# Patient Record
Sex: Male | Born: 1970 | Hispanic: Yes | Marital: Married | Smoking: Never smoker
Health system: Southern US, Community
[De-identification: ages and names within clinical notes are randomized; demographics above are authoritative.]

---

## 2019-02-10 ENCOUNTER — Encounter: Payer: Self-pay | Admitting: Emergency Medicine

## 2019-02-10 ENCOUNTER — Emergency Department: Payer: Self-pay

## 2019-02-10 ENCOUNTER — Other Ambulatory Visit: Payer: Self-pay

## 2019-02-10 ENCOUNTER — Inpatient Hospital Stay
Admission: EM | Admit: 2019-02-10 | Discharge: 2019-02-13 | DRG: 871 | Disposition: A | Discharge status: Other Acute Inpatient Hospital | Payer: Self-pay | Attending: Internal Medicine | Admitting: Internal Medicine

## 2019-02-10 DIAGNOSIS — J1289 Other viral pneumonia: Secondary | ICD-10-CM | POA: Diagnosis present

## 2019-02-10 DIAGNOSIS — J069 Acute upper respiratory infection, unspecified: Secondary | ICD-10-CM | POA: Diagnosis not present

## 2019-02-10 DIAGNOSIS — U071 COVID-19: Secondary | ICD-10-CM | POA: Diagnosis present

## 2019-02-10 DIAGNOSIS — Z6841 Body Mass Index (BMI) 40.0 and over, adult: Secondary | ICD-10-CM

## 2019-02-10 DIAGNOSIS — Z7982 Long term (current) use of aspirin: Secondary | ICD-10-CM

## 2019-02-10 DIAGNOSIS — R0902 Hypoxemia: Secondary | ICD-10-CM

## 2019-02-10 DIAGNOSIS — A419 Sepsis, unspecified organism: Secondary | ICD-10-CM | POA: Diagnosis present

## 2019-02-10 DIAGNOSIS — A4189 Other specified sepsis: Principal | ICD-10-CM | POA: Diagnosis present

## 2019-02-10 DIAGNOSIS — Z23 Encounter for immunization: Secondary | ICD-10-CM

## 2019-02-10 DIAGNOSIS — J9601 Acute respiratory failure with hypoxia: Secondary | ICD-10-CM | POA: Diagnosis present

## 2019-02-10 LAB — COMPREHENSIVE METABOLIC PANEL
ALT: 105 U/L — ABNORMAL HIGH (ref 0–44)
AST: 61 U/L — ABNORMAL HIGH (ref 15–41)
Albumin: 3.6 g/dL (ref 3.5–5.0)
Alkaline Phosphatase: 71 U/L (ref 38–126)
Anion gap: 8 (ref 5–15)
BUN: 11 mg/dL (ref 6–20)
CO2: 22 mmol/L (ref 22–32)
Calcium: 8.1 mg/dL — ABNORMAL LOW (ref 8.9–10.3)
Chloride: 102 mmol/L (ref 98–111)
Creatinine, Ser: 1.02 mg/dL (ref 0.61–1.24)
GFR calc Af Amer: >60 mL/min (ref >60)
GFR calc non Af Amer: >60 mL/min (ref >60)
Glucose, Bld: 157 mg/dL — ABNORMAL HIGH (ref 70–99)
Potassium: 3.7 mmol/L (ref 3.5–5.1)
Sodium: 132 mmol/L — ABNORMAL LOW (ref 135–145)
Total Bilirubin: 0.5 mg/dL (ref 0.3–1.2)
Total Protein: 7.2 g/dL (ref 6.5–8.1)

## 2019-02-10 LAB — CBC WITH DIFFERENTIAL/PLATELET
Basophils Absolute: 0 10*3/uL (ref 0.0–0.1)
Basophils Relative: 0 %
Eosinophils Absolute: 0 10*3/uL (ref 0.0–0.5)
Eosinophils Relative: 0 %
HCT: 40.8 % (ref 39.0–52.0)
Hemoglobin: 14.4 g/dL (ref 13.0–17.0)
Lymphocytes Relative: 24 %
Lymphs Abs: 1.2 10*3/uL (ref 0.7–4.0)
MCH: 30.1 pg (ref 26.0–34.0)
MCHC: 35.3 g/dL (ref 30.0–36.0)
MCV: 85.2 fL (ref 80.0–100.0)
Monocytes Absolute: 0.3 10*3/uL (ref 0.1–1.0)
Monocytes Relative: 7 %
Neutro Abs: 3.3 10*3/uL (ref 1.7–7.7)
Neutrophils Relative %: 69 %
Platelets: 180 10*3/uL (ref 150–400)
RBC: 4.79 MIL/uL (ref 4.22–5.81)
RDW: 13.2 % (ref 11.5–15.5)
WBC: 4.9 10*3/uL (ref 4.0–10.5)
nRBC: 0 % (ref 0.0–0.2)

## 2019-02-10 LAB — FIBRIN DERIVATIVES D-DIMER (ARMC ONLY): Fibrin derivatives D-dimer (ARMC): 700.06 ng/mL (FEU) — ABNORMAL HIGH (ref 0.00–499.00)

## 2019-02-10 LAB — LACTIC ACID, PLASMA
Lactic Acid, Venous: 1.3 mmol/L (ref 0.5–1.9)
Lactic Acid, Venous: 1.2 mmol/L (ref 0.5–1.9)

## 2019-02-10 LAB — TYPE AND SCREEN
ABO/RH(D): O POS
Antibody Screen: NEGATIVE

## 2019-02-10 LAB — C-REACTIVE PROTEIN: CRP: 5.1 mg/dL — ABNORMAL HIGH (ref <1.0)

## 2019-02-10 LAB — TROPONIN I (HIGH SENSITIVITY): Troponin I (High Sensitivity): 6 ng/L (ref <18)

## 2019-02-10 LAB — ABO/RH: ABO/RH(D): O POS

## 2019-02-10 LAB — PROCALCITONIN: Procalcitonin: 0.1 ng/mL

## 2019-02-10 LAB — FERRITIN: Ferritin: 465 ng/mL — ABNORMAL HIGH (ref 24–336)

## 2019-02-10 LAB — FIBRINOGEN: Fibrinogen: 606 mg/dL — ABNORMAL HIGH (ref 210–475)

## 2019-02-10 LAB — HIV ANTIBODY (ROUTINE TESTING W REFLEX): HIV Screen 4th Generation wRfx: NONREACTIVE

## 2019-02-10 LAB — LACTATE DEHYDROGENASE: LDH: 323 U/L — ABNORMAL HIGH (ref 98–192)

## 2019-02-10 LAB — TRIGLYCERIDES: Triglycerides: 72 mg/dL (ref <150)

## 2019-02-10 MED ORDER — ALBUTEROL SULFATE HFA 108 (90 BASE) MCG/ACT IN AERS
2.0000 | INHALATION_SPRAY | RESPIRATORY_TRACT | Status: DC | PRN
  Administered 2019-02-10: 2 via RESPIRATORY_TRACT
  Filled 2019-02-10: qty 6.7

## 2019-02-10 MED ORDER — ONDANSETRON HCL 4 MG/2ML IJ SOLN: 4.0000 mg | Freq: Three times a day (TID) | INTRAMUSCULAR | Status: DC | PRN

## 2019-02-10 MED ORDER — METHYLPREDNISOLONE SODIUM SUCC 40 MG IJ SOLR
40.0000 mg | Freq: Two times a day (BID) | INTRAMUSCULAR | Status: DC
  Administered 2019-02-10: 40 mg via INTRAVENOUS
  Filled 2019-02-10 (×2): qty 1

## 2019-02-10 MED ORDER — DEXAMETHASONE SODIUM PHOSPHATE 10 MG/ML IJ SOLN
10.0000 mg | Freq: Once | INTRAMUSCULAR | Status: AC
  Administered 2019-02-10: 05:00:00 10 mg via INTRAVENOUS

## 2019-02-10 MED ORDER — ENOXAPARIN SODIUM 40 MG/0.4ML ~~LOC~~ SOLN
40.0000 mg | SUBCUTANEOUS | Status: DC
  Administered 2019-02-10 – 2019-02-12 (×3): 40 mg via SUBCUTANEOUS
  Filled 2019-02-10 (×3): qty 0.4

## 2019-02-10 MED ORDER — GUAIFENESIN 100 MG/5ML PO SOLN
5.0000 mL | ORAL | Status: DC | PRN
  Administered 2019-02-12: 100 mg via ORAL
  Filled 2019-02-10 (×3): qty 5

## 2019-02-10 MED ORDER — SODIUM CHLORIDE 0.9 % IV SOLN
100.0000 mg | Freq: Every day | INTRAVENOUS | Status: DC
  Administered 2019-02-11 – 2019-02-13 (×3): 100 mg via INTRAVENOUS
  Filled 2019-02-10: qty 100
  Filled 2019-02-10: qty 20
  Filled 2019-02-10 (×2): qty 100

## 2019-02-10 MED ORDER — ZINC SULFATE 220 (50 ZN) MG PO CAPS
220.0000 mg | ORAL_CAPSULE | Freq: Every day | ORAL | Status: DC
  Administered 2019-02-10 – 2019-02-13 (×4): 220 mg via ORAL
  Filled 2019-02-10 (×4): qty 1

## 2019-02-10 MED ORDER — INFLUENZA VAC SPLIT QUAD 0.5 ML IM SUSY: 0.5000 mL | PREFILLED_SYRINGE | INTRAMUSCULAR | Status: DC

## 2019-02-10 MED ORDER — SODIUM CHLORIDE 0.9 % IV SOLN
200.0000 mg | Freq: Once | INTRAVENOUS | Status: AC
  Administered 2019-02-10: 200 mg via INTRAVENOUS
  Filled 2019-02-10: qty 200

## 2019-02-10 MED ORDER — GUAIFENESIN ER 600 MG PO TB12
600.0000 mg | ORAL_TABLET | Freq: Two times a day (BID) | ORAL | Status: DC
  Administered 2019-02-10 – 2019-02-13 (×7): 600 mg via ORAL
  Filled 2019-02-10 (×9): qty 1

## 2019-02-10 MED ORDER — HYDROCOD POLST-CPM POLST ER 10-8 MG/5ML PO SUER
5.0000 mL | Freq: Once | ORAL | Status: AC
  Administered 2019-02-10: 5 mL via ORAL
  Filled 2019-02-10: qty 5

## 2019-02-10 MED ORDER — DEXTROMETHORPHAN POLISTIREX ER 30 MG/5ML PO SUER
30.0000 mg | Freq: Two times a day (BID) | ORAL | Status: DC
  Administered 2019-02-10 – 2019-02-13 (×7): 30 mg via ORAL
  Filled 2019-02-10 (×11): qty 5

## 2019-02-10 MED ORDER — IPRATROPIUM BROMIDE HFA 17 MCG/ACT IN AERS
2.0000 | INHALATION_SPRAY | RESPIRATORY_TRACT | Status: DC
  Administered 2019-02-10 – 2019-02-13 (×17): 2 via RESPIRATORY_TRACT
  Filled 2019-02-10 (×3): qty 12.9

## 2019-02-10 MED ORDER — VITAMIN C 500 MG PO TABS
500.0000 mg | ORAL_TABLET | Freq: Every day | ORAL | Status: DC
  Administered 2019-02-10 – 2019-02-13 (×4): 500 mg via ORAL
  Filled 2019-02-10 (×4): qty 1

## 2019-02-10 MED ORDER — ACETAMINOPHEN 325 MG PO TABS
650.0000 mg | ORAL_TABLET | Freq: Four times a day (QID) | ORAL | Status: DC | PRN
  Administered 2019-02-10 – 2019-02-13 (×4): 650 mg via ORAL
  Filled 2019-02-10 (×4): qty 2

## 2019-02-10 MED ORDER — DM-GUAIFENESIN ER 30-600 MG PO TB12: 1.0000 | ORAL_TABLET | Freq: Two times a day (BID) | ORAL | Status: DC

## 2019-02-10 MED ORDER — DEXAMETHASONE SODIUM PHOSPHATE 10 MG/ML IJ SOLN
INTRAMUSCULAR | Status: AC
  Administered 2019-02-10: 10 mg via INTRAVENOUS
  Filled 2019-02-10: qty 1

## 2019-02-10 MED ORDER — METHYLPREDNISOLONE SODIUM SUCC 125 MG IJ SOLR
60.0000 mg | Freq: Two times a day (BID) | INTRAMUSCULAR | Status: DC
  Administered 2019-02-10 – 2019-02-13 (×6): 60 mg via INTRAVENOUS
  Filled 2019-02-10 (×3): qty 2
  Filled 2019-02-10: qty 0.96
  Filled 2019-02-10 (×3): qty 2

## 2019-02-10 MED ORDER — SENNOSIDES-DOCUSATE SODIUM 8.6-50 MG PO TABS
1.0000 | ORAL_TABLET | Freq: Every evening | ORAL | Status: DC | PRN
  Filled 2019-02-10: qty 1

## 2019-02-10 NOTE — ED Notes (Signed)
Received report from [place], [place].

## 2019-02-10 NOTE — Progress Notes (Signed)
Remdesivir - Pharmacy Brief Note   O:  ALT:  CXR:  SpO2: % on    A/P:  Remdesivir 200 mg IVPB once followed by 100 mg IVPB daily x 4 days.   Hart Robinsons, PharmD Clinical Pharmacist 02/10/2019   02/10/2019 5:23 AM

## 2019-02-10 NOTE — ED Notes (Signed)
Pt still watching tv. Resting. States family on way to drop off personal items. Bed locked low. Rail up. Call bell within reach.

## 2019-02-10 NOTE — ED Notes (Signed)
Pt urinated 600cc into urinal before leaving for floor.

## 2019-02-10 NOTE — ED Triage Notes (Addendum)
Patient ambulatory to triage with steady gait, without difficulty or distress noted, mask in place; pt reports SHOB and mid CP; +COVID on Saturday; took ASA PTA for fever

## 2019-02-10 NOTE — H&P (Signed)
History and Physical    Walter Oliver [id number]:[id number] DOB: 1970-03-31 DOA: 02/10/2019  Referring MD/NP/PA:   PCP: Patient, No Pcp Per   Patient coming from:  The patient is coming from home.  At baseline, pt is independent for most of ADL.        Chief Complaint: SOB  HPI: Walter Oliver is a 48 y.o. male without significant medical history, who presents with SOB.  Per his son (I called his son by phone), pt has positive COVID 19 test on Saturday. He has cough and SOB, which has been progressively worsening.  Patient also has mild chest pain, fever and chills.  No nausea, vomiting, diarrhea or abdominal pain.  No symptoms of UTI.  Patient still takes aspirin for fever at home.  ED Course: pt was found to have WBC 4.9, trop 6, lactic acid 1.3, D-dimer 700, LDH 323, procalcitonin <010, ferritin 465, fibrinogen 606, TG 72.  Chest x-ray showed diffuse the bilateral effusion.  Patient is admitted to [facility] bed as inpatient.  Review of Systems:   General: has fevers, chills, no body weight gain,  has fatigue HEENT: no blurry vision, hearing changes or sore throat Respiratory: has dyspnea, coughing, no wheezing CV: no chest pain, no palpitations GI: no nausea, vomiting, abdominal pain, diarrhea, constipation GU: no dysuria, burning on urination, increased urinary frequency, hematuria  Ext: no leg edema Neuro: no unilateral weakness, numbness, or tingling, no vision change or hearing loss Skin: no rash, no skin tear. MSK: No muscle spasm, no deformity, no limitation of range of movement in spin Heme: No easy bruising.  Travel history: No recent long distant travel.  Allergy: No Known Allergies  History reviewed. No pertinent past medical history.  History reviewed. No pertinent surgical history.  Social History:  reports that he has never smoked. He has never used smokeless tobacco. He reports that he does not drink alcohol or use drugs.  Family History: No family history on  file. reviewed, per his son, all family members are healthy no significant medical issues.  Prior to Admission medications   Medication Sig Start Date End Date Taking? Authorizing Provider  aspirin EC 325 MG tablet Take 325 mg by mouth every 6 (six) hours as needed for fever.   Yes [provider]    Physical Exam: Vitals:   02/10/19 1424 02/10/19 1500 02/10/19 1622 02/10/19 1622  BP: 132/66 (!) 108/55 139/85 139/85  Pulse: 89 91 93 93  Resp:   20   Temp: 98.6 F (37 C)  98.7 F (37.1 C) 98.7 F (37.1 C)  TempSrc: Oral  Oral Oral  SpO2: 93% 91%  91%  Weight:   117.9 kg    General: Not in acute distress HEENT:       Eyes: PERRL, EOMI, no scleral icterus.       ENT: No discharge from the ears and nose, no pharynx injection, no tonsillar enlargement.        Neck: No JVD, no bruit, no mass felt. Heme: No neck lymph node enlargement. Cardiac: S1/S2, RRR, No murmurs, No gallops or rubs. Respiratory: No rales, wheezing, rhonchi or rubs. GI: Soft, nondistended, nontender, no rebound pain, no organomegaly, BS present. GU: No hematuria Ext: No pitting leg edema bilaterally. 2+DP/PT pulse bilaterally. Musculoskeletal: No joint deformities, No joint redness or warmth, no limitation of ROM in spin. Skin: No rashes.  Neuro: Alert, oriented X3, cranial nerves II-XII grossly intact, moves all extremities normally.  Psych: Patient is not psychotic, no  suicidal or hemocidal ideation.  Labs on Admission: I have personally reviewed following labs and imaging studies  CBC: Recent Labs  Lab 02/10/19 0051  WBC 4.9  NEUTROABS 3.3  HGB 14.4  HCT 40.8  MCV 85.2  PLT 180   Basic Metabolic Panel: Recent Labs  Lab 02/10/19 0051  NA 132*  K 3.7  CL 102  CO2 22  GLUCOSE 157*  BUN 11  CREATININE 1.02  CALCIUM 8.1*   GFR: CrCl cannot be calculated (Unknown ideal weight.). Liver Function Tests: Recent Labs  Lab 02/10/19 0051  AST 61*  ALT 105*  ALKPHOS 71  BILITOT 0.5   PROT 7.2  ALBUMIN 3.6   No results for input(s): LIPASE, AMYLASE in the last 168 hours. No results for input(s): AMMONIA in the last 168 hours. Coagulation Profile: No results for input(s): INR, PROTIME in the last 168 hours. Cardiac Enzymes: No results for input(s): CKTOTAL, CKMB, CKMBINDEX, TROPONINI in the last 168 hours. BNP (last 3 results) No results for input(s): PROBNP in the last 8760 hours. HbA1C: No results for input(s): HGBA1C in the last 72 hours. CBG: No results for input(s): GLUCAP in the last 168 hours. Lipid Profile: Recent Labs    02/10/19 0424  TRIG 72   Thyroid Function Tests: No results for input(s): TSH, T4TOTAL, FREET4, T3FREE, THYROIDAB in the last 72 hours. Anemia Panel: Recent Labs    02/10/19 0424  FERRITIN 465*   Urine analysis: No results found for: COLORURINE, APPEARANCEUR, LABSPEC, PHURINE, GLUCOSEU, HGBUR, BILIRUBINUR, KETONESUR, PROTEINUR, UROBILINOGEN, NITRITE, LEUKOCYTESUR Sepsis Labs: @LABRCNTIP (procalcitonin:4,lacticidven:4) ) Recent Results (from the past 240 hour(s))  Blood Culture (routine x 2)     Status: None (Preliminary result)   Collection Time: 02/10/19  4:24 AM   Specimen: BLOOD  Result Value Ref Range Status   Specimen Description BLOOD LEFT FA  Final   Special Requests   Final    BOTTLES DRAWN AEROBIC AND ANAEROBIC Blood Culture adequate volume   Culture   Final    NO GROWTH <12 HOURS Performed at [facility], [address] [place], [place]    Report Status PENDING  Incomplete  Blood Culture (routine x 2)     Status: None (Preliminary result)   Collection Time: 02/10/19  4:24 AM   Specimen: BLOOD  Result Value Ref Range Status   Specimen Description BLOOD RIGHT AC  Final   Special Requests   Final    BOTTLES DRAWN AEROBIC AND ANAEROBIC Blood Culture results may not be optimal due to an excessive volume of blood received in culture bottles   Culture   Final    NO GROWTH <12 HOURS Performed  at [facility], [address] [place], [place]    Report Status PENDING  Incomplete     Radiological Exams on Admission: Dg Chest 2 View  Result Date: 02/10/2019 CLINICAL DATA:  Shortness of breath EXAM: CHEST - 2 VIEW COMPARISON:  None. FINDINGS: There are diffuse bilateral pulmonary opacities. The lung volumes are low. There is no pneumothorax. No large pleural effusion. Heart size appears mildly enlarged. IMPRESSION: Diffuse bilateral pulmonary opacities which can be seen in patients with an atypical infectious process such as viral pneumonia. Electronically Signed   By: 14/10/2018 M.D.   On: 02/10/2019 01:17     EKG: Independently reviewed.  Sinus rhythm, QTC 436, T wave inversion in lead III/aVF  Assessment/Plan Principal Problem:   Acute respiratory disease due to COVID-19 virus  Acute respiratory disease due to COVID-19 virus: current O2 sat 93 on RA. -will admit to med-surg bed as inpt -Remdesivir per pharm -Solumedrol 60 mg bid -vitamin C, zinc.   -Atrovent inhaler, PRN albuterol inhaler -PRN Mucinex for cough - follow-up flu PCR and RVP -f/u Blood culture -D-dimer, BNP,Trop, LFT, CRP, LDH, Procalcitonin, ferritin, fibinogen, TG, Hep B SAg, HIV ab -Daily CRP, Ferritin, D-dimer, -if desaturate, will ask the patient to maintain an awake prone position for 16+ hours a day, if possible, with a minimum of 2-3 hours at a time -Will attempt to maintain euvolemia to a net negative fluid status   DVT ppx: SQ Lovenox Code Status: Full code Family Communication: I called his son by phone Disposition Plan:  Anticipate discharge back to previous home environment Consults called:  none Admission status: med surg bed/as inpt  Date of Service 02/10/2019    Lorretta HarpXilin Alasia Enge [facility]   If 7PM-7AM, please contact night-coverage [email or web address] Password [email or web address] 02/10/2019, 9:57 PM

## 2019-02-10 NOTE — ED Notes (Signed)
Pt given bag of belongings dropped off by family.

## 2019-02-10 NOTE — ED Notes (Addendum)
Pt given lunch tray by NT. Pt watching tv.

## 2019-02-10 NOTE — ED Provider Notes (Signed)
[facility] Emergency Department Provider Note   ____________________________________________   First MD Initiated Contact with Patient 02/10/19 0407     (approximate)  I have reviewed the triage vital signs and the nursing notes.   HISTORY  Chief Complaint Cough and Shortness of Breath    HPI Walter Oliver is a 48 y.o. male who presents to the ED from home with a chief complaint of cough, mid chest pain and shortness of breath.  Patient tested positive for COVID-19 on 12/1.  Has been taking aspirin for fever.  Denies abdominal pain, nausea, vomiting or diarrhea.  Wife also has COVID-19.       Past medical history None  There are no active problems to display for this patient.   History reviewed. No pertinent surgical history.  Prior to Admission medications   Not on File    Allergies Patient has no known allergies.  No family history on file.  Social History Social History   Tobacco Use  . Smoking status: Never Smoker  . Smokeless tobacco: Never Used  Substance Use Topics  . Alcohol use: Not on file  . Drug use: Not on file    Review of Systems  Constitutional: No fever/chills Eyes: No visual changes. ENT: No sore throat. Cardiovascular: Positive for chest pain. Respiratory: Positive for cough and shortness of breath. Gastrointestinal: No abdominal pain.  No nausea, no vomiting.  No diarrhea.  No constipation. Genitourinary: Negative for dysuria. Musculoskeletal: Negative for back pain. Skin: Negative for rash. Neurological: Negative for headaches, focal weakness or numbness.   ____________________________________________   PHYSICAL EXAM:  VITAL SIGNS: ED Triage Vitals  Enc Vitals Group     BP 02/10/19 0046 138/68     Pulse Rate 02/10/19 0046 (!) 103     Resp 02/10/19 0046 20     Temp 02/10/19 0046 100.3 F (37.9 C)     Temp Source 02/10/19 0046 Oral     SpO2 02/10/19 0046 93 %     Weight 02/10/19 0047 260  lb (117.9 kg)     Height --      Head Circumference --      Peak Flow --      Pain Score 02/10/19 0045 8     Pain Loc --      Pain Edu? --      Excl. in GC? --     Constitutional: Alert and oriented. Well appearing and in mild acute distress. Eyes: Conjunctivae are normal. PERRL. EOMI. Head: Atraumatic. Nose: No congestion/rhinnorhea. Mouth/Throat: Mucous membranes are moist.  Oropharynx non-erythematous. Neck: No stridor.   Cardiovascular: Tachycardic rate, regular rhythm. Grossly normal heart sounds.  Good peripheral circulation. Respiratory: Increased respiratory effort.  No retractions. Lungs with scattered rhonchi. Gastrointestinal: Soft and nontender. No distention. No abdominal bruits. No CVA tenderness. Musculoskeletal: No lower extremity tenderness nor edema.  No joint effusions. Neurologic:  Normal speech and language. No gross focal neurologic deficits are appreciated. No gait instability. Skin:  Skin is warm, dry and intact. No rash noted. Psychiatric: Mood and affect are normal. Speech and behavior are normal.  ____________________________________________   LABS (all labs ordered are listed, but only abnormal results are displayed)  Labs Reviewed  COMPREHENSIVE METABOLIC PANEL - Abnormal; Notable for the following components:      Result Value   Sodium 132 (*)    Glucose, Bld 157 (*)    Calcium 8.1 (*)    AST 61 (*)    ALT 105 (*)  All other components within normal limits  CULTURE, BLOOD (ROUTINE X 2)  CULTURE, BLOOD (ROUTINE X 2)  CBC WITH DIFFERENTIAL/PLATELET  LACTIC ACID, PLASMA  LACTIC ACID, PLASMA  FIBRIN DERIVATIVES D-DIMER ([facility] ONLY)  PROCALCITONIN  LACTATE DEHYDROGENASE  FERRITIN  TRIGLYCERIDES  FIBRINOGEN  C-REACTIVE PROTEIN  TROPONIN I (HIGH SENSITIVITY)   ____________________________________________  EKG  ED ECG REPORT I, SUNG,JADE J, the attending physician, personally viewed and interpreted this ECG.   Date: 02/10/2019  EKG  Time: 0050  Rate: 100  Rhythm: normal EKG, normal sinus rhythm  Axis: Normal  Intervals:none  ST&T Change: T wave inversion  ____________________________________________  RADIOLOGY  ED MD interpretation: COVID-19 pneumonia  Official radiology report(s): Dg Chest 2 View  Result Date: 02/10/2019 CLINICAL DATA:  Shortness of breath EXAM: CHEST - 2 VIEW COMPARISON:  None. FINDINGS: There are diffuse bilateral pulmonary opacities. The lung volumes are low. There is no pneumothorax. No large pleural effusion. Heart size appears mildly enlarged. IMPRESSION: Diffuse bilateral pulmonary opacities which can be seen in patients with an atypical infectious process such as viral pneumonia. Electronically Signed   By: Constance Holster M.D.   On: 02/10/2019 01:17    ____________________________________________   PROCEDURES  Procedure(s) performed (including Critical Care):  Procedures CRITICAL CARE Performed by: Paulette Blanch   Total critical care time: 30 minutes  Critical care time was exclusive of separately billable procedures and treating other patients.  Critical care was necessary to treat or prevent imminent or life-threatening deterioration.  Critical care was time spent personally by me on the following activities: development of treatment plan with patient and/or surrogate as well as nursing, discussions with consultants, evaluation of patient's response to treatment, examination of patient, obtaining history from patient or surrogate, ordering and performing treatments and interventions, ordering and review of laboratory studies, ordering and review of radiographic studies, pulse oximetry and re-evaluation of patient's condition.  ____________________________________________   INITIAL IMPRESSION / ASSESSMENT AND PLAN / ED COURSE  As part of my medical decision making, I reviewed the following data within the [facility] notes reviewed and incorporated,  Labs reviewed, EKG interpreted, Old chart reviewed, Radiograph reviewed, Discussed with admitting physician and Notes from prior ED visits     [name] was evaluated in Emergency Department on 02/10/2019 for the symptoms described in the history of present illness. He was evaluated in the context of the global COVID-19 pandemic, which necessitated consideration that the patient might be at risk for infection with the SARS-CoV-2 virus that causes COVID-19. Institutional protocols and algorithms that pertain to the evaluation of patients at risk for COVID-19 are in a state of rapid change based on information released by regulatory bodies including the CDC and federal and state organizations. These policies and algorithms were followed during the patient's care in the ED.    48 year old male with COVID-19 who presents with chest pain, cough and shortness of breath. Differential includes, but is not limited to, viral syndrome, bronchitis including COPD exacerbation, pneumonia, reactive airway disease including asthma, CHF including exacerbation with or without pulmonary/interstitial edema, pneumothorax, ACS, thoracic trauma, and pulmonary embolism.  Laboratory results unremarkable.  Chest x-ray consistent with atypical process.  Will start IV Decadron; ambulate with pulse ox.   Clinical Course as of Feb 09 501  Tue Feb 10, 2019  0502 Patient ambulated very slowly to treatment room.  On arrival his room air saturations were 74%.  He rebounded quickly on 3 L nasal cannula oxygen and rest.  Currently on 3 L oxygen he is 95%.   [JS]    Clinical Course User Index [JS] Irean Hong, MD     ____________________________________________   FINAL CLINICAL IMPRESSION(S) / ED DIAGNOSES  Final diagnoses:  Pneumonia due to COVID-19 virus  Hypoxia     ED Discharge Orders    None       Note:  This document was prepared using Dragon voice recognition software and may include unintentional  dictation errors.   Irean Hong, MD 02/10/19 [phone]

## 2019-02-10 NOTE — ED Notes (Signed)
Pt given cup of water. Denies any needs currently. Bed locked low. Rail up. Call bell within reach. Urinal attached to bed rail.

## 2019-02-11 DIAGNOSIS — A419 Sepsis, unspecified organism: Secondary | ICD-10-CM

## 2019-02-11 DIAGNOSIS — J80 Acute respiratory distress syndrome: Secondary | ICD-10-CM | POA: Diagnosis present

## 2019-02-11 DIAGNOSIS — J1289 Other viral pneumonia: Principal | ICD-10-CM | POA: Diagnosis present

## 2019-02-11 DIAGNOSIS — R6521 Severe sepsis with septic shock: Secondary | ICD-10-CM | POA: Diagnosis present

## 2019-02-11 DIAGNOSIS — U071 COVID-19: Secondary | ICD-10-CM | POA: Diagnosis present

## 2019-02-11 LAB — CBC WITH DIFFERENTIAL/PLATELET
Abs Immature Granulocytes: 0.02 10*3/uL (ref 0.00–0.07)
Basophils Absolute: 0 10*3/uL (ref 0.0–0.1)
Basophils Relative: 0 %
Eosinophils Absolute: 0 10*3/uL (ref 0.0–0.5)
Eosinophils Relative: 0 %
HCT: 40.1 % (ref 39.0–52.0)
Hemoglobin: 14 g/dL (ref 13.0–17.0)
Immature Granulocytes: 0 %
Lymphocytes Relative: 11 %
Lymphs Abs: 0.6 10*3/uL — ABNORMAL LOW (ref 0.7–4.0)
MCH: 29.9 pg (ref 26.0–34.0)
MCHC: 34.9 g/dL (ref 30.0–36.0)
MCV: 85.5 fL (ref 80.0–100.0)
Monocytes Absolute: 0.4 10*3/uL (ref 0.1–1.0)
Monocytes Relative: 6 %
Neutro Abs: 4.7 10*3/uL (ref 1.7–7.7)
Neutrophils Relative %: 83 %
Platelets: 224 10*3/uL (ref 150–400)
RBC: 4.69 MIL/uL (ref 4.22–5.81)
RDW: 13.3 % (ref 11.5–15.5)
WBC: 5.7 10*3/uL (ref 4.0–10.5)
nRBC: 0 % (ref 0.0–0.2)

## 2019-02-11 LAB — COMPREHENSIVE METABOLIC PANEL
ALT: 97 U/L — ABNORMAL HIGH (ref 0–44)
AST: 53 U/L — ABNORMAL HIGH (ref 15–41)
Albumin: 3.3 g/dL — ABNORMAL LOW (ref 3.5–5.0)
Alkaline Phosphatase: 63 U/L (ref 38–126)
Anion gap: 10 (ref 5–15)
BUN: 14 mg/dL (ref 6–20)
CO2: 24 mmol/L (ref 22–32)
Calcium: 8.4 mg/dL — ABNORMAL LOW (ref 8.9–10.3)
Chloride: 103 mmol/L (ref 98–111)
Creatinine, Ser: 0.74 mg/dL (ref 0.61–1.24)
GFR calc Af Amer: >60 mL/min (ref >60)
GFR calc non Af Amer: >60 mL/min (ref >60)
Glucose, Bld: 184 mg/dL — ABNORMAL HIGH (ref 70–99)
Potassium: 4.2 mmol/L (ref 3.5–5.1)
Sodium: 137 mmol/L (ref 135–145)
Total Bilirubin: 0.7 mg/dL (ref 0.3–1.2)
Total Protein: 7.2 g/dL (ref 6.5–8.1)

## 2019-02-11 LAB — MAGNESIUM: Magnesium: 2.3 mg/dL (ref 1.7–2.4)

## 2019-02-11 LAB — C-REACTIVE PROTEIN: CRP: 3.6 mg/dL — ABNORMAL HIGH (ref <1.0)

## 2019-02-11 LAB — FIBRIN DERIVATIVES D-DIMER (ARMC ONLY): Fibrin derivatives D-dimer (ARMC): 564.06 ng/mL (FEU) — ABNORMAL HIGH (ref 0.00–499.00)

## 2019-02-11 LAB — FERRITIN: Ferritin: 455 ng/mL — ABNORMAL HIGH (ref 24–336)

## 2019-02-11 LAB — HEPATITIS B SURFACE ANTIGEN: Hepatitis B Surface Ag: NONREACTIVE

## 2019-02-11 MED ORDER — SODIUM CHLORIDE 0.9 % IV SOLN
INTRAVENOUS | Status: DC | PRN
  Administered 2019-02-11: 500 mL via INTRAVENOUS
  Administered 2019-02-12: 30 mL via INTRAVENOUS
  Administered 2019-02-13: 10 mL via INTRAVENOUS

## 2019-02-11 NOTE — Progress Notes (Addendum)
Called by RN to place pt on CPAP now. Pt placed on CPAP 10cmH20, (V-72) per COVID policy with CPAP pts. SPO2 100% per NP order >95%. RN aware of pt trying to tolerate CPAP.

## 2019-02-11 NOTE — Progress Notes (Addendum)
Progress Note    Walter Oliver  VXY:[id number] DOB: August 29, 1970  DOA: 02/10/2019 PCP: Patient, No Pcp Per          Assessment/Plan:   Principal Problem:   Pneumonia due to COVID-19 virus Active Problems:   Acute respiratory disease due to COVID-19 virus   Sepsis (HCC)   There is no height or weight on file to calculate BMI.    Sepsis secondary to COVID-19 pneumonia: Continue IV remdesivir and dexamethasone.  Acute hypoxemic respiratory failure: He is on high flow nasal cannula requiring up to 15 L/min.  Taper off oxygen as able.  He is at high risk for decompensation.    Family Communication/Anticipated D/C date and plan/Code Status   DVT prophylaxis: Lovenox Code Status: Full code Family Communication: None  disposition Plan: To be determined     Subjective:   C/o cough and shortness of breath.  Objective:    Vitals:   02/10/19 2301 02/11/19 0619 02/11/19 0738 02/11/19 1200  BP: 134/78 (!) 145/81  135/73  Pulse: (!) 107 95  94  Resp: (!) 25 (!) 36    Temp: 98.1 F (36.7 C) 99.1 F (37.3 C)  98.7 F (37.1 C)  TempSrc: Oral Oral  Oral  SpO2: 95% 100% 90% 90%  Weight:        Intake/Output Summary (Last 24 hours) at 02/11/2019 1707 Last data filed at 02/11/2019 1200 Gross per 24 hour  Intake 0 ml  Output 800 ml  Net -800 ml   Filed Weights   02/10/19 0047 02/10/19 1622  Weight: 117.9 kg 117.9 kg    Exam:  GEN: NAD SKIN: No rash EYES: EOMI ENT: MMM CV: RRR PULM: CTA B ABD: soft, ND, NT, +BS CNS: AAO x 3, non focal EXT: No edema or tenderness   Data Reviewed:   I have personally reviewed following labs and imaging studies:  Labs: Labs show the following:   Basic Metabolic Panel: Recent Labs  Lab 02/10/19 0051 02/11/19 0606  NA 132* 137  K 3.7 4.2  CL 102 103  CO2 22 24  GLUCOSE 157* 184*  BUN 11 14  CREATININE 1.02 0.74  CALCIUM 8.1* 8.4*  MG  --  2.3   GFR CrCl cannot be calculated (Unknown ideal weight.).  Liver Function Tests: Recent Labs  Lab 02/10/19 0051 02/11/19 0606  AST 61* 53*  ALT 105* 97*  ALKPHOS 71 63  BILITOT 0.5 0.7  PROT 7.2 7.2  ALBUMIN 3.6 3.3*   No results for input(s): LIPASE, AMYLASE in the last 168 hours. No results for input(s): AMMONIA in the last 168 hours. Coagulation profile No results for input(s): INR, PROTIME in the last 168 hours.  CBC: Recent Labs  Lab 02/10/19 0051 02/11/19 0606  WBC 4.9 5.7  NEUTROABS 3.3 4.7  HGB 14.4 14.0  HCT 40.8 40.1  MCV 85.2 85.5  PLT 180 224   Cardiac Enzymes: No results for input(s): CKTOTAL, CKMB, CKMBINDEX, TROPONINI in the last 168 hours. BNP (last 3 results) No results for input(s): PROBNP in the last 8760 hours. CBG: No results for input(s): GLUCAP in the last 168 hours. D-Dimer: No results for input(s): DDIMER in the last 72 hours. Hgb A1c: No results for input(s): HGBA1C in the last 72 hours. Lipid Profile: Recent Labs    02/10/19 0424  TRIG 72   Thyroid function studies: No results for input(s): TSH, T4TOTAL, T3FREE, THYROIDAB in the last 72 hours.  Invalid input(s): FREET3 Anemia work  up: Recent Labs    02/10/19 0424 02/11/19 0606  FERRITIN 465* 455*   Sepsis Labs: Recent Labs  Lab 02/10/19 0051 02/10/19 0424 02/10/19 0822 02/11/19 0606  PROCALCITON  --  0.10  --   --   WBC 4.9  --   --  5.7  LATICACIDVEN  --  1.3 1.2  --     Microbiology Recent Results (from the past 240 hour(s))  Blood Culture (routine x 2)     Status: None (Preliminary result)   Collection Time: 02/10/19  4:24 AM   Specimen: BLOOD  Result Value Ref Range Status   Specimen Description BLOOD LEFT FA  Final   Special Requests   Final    BOTTLES DRAWN AEROBIC AND ANAEROBIC Blood Culture adequate volume   Culture   Final    NO GROWTH 1 DAY Performed at [facility], [address] [place], [place] [address]    Report Status PENDING  Incomplete  Blood Culture (routine x 2)     Status: None  (Preliminary result)   Collection Time: 02/10/19  4:24 AM   Specimen: BLOOD  Result Value Ref Range Status   Specimen Description BLOOD RIGHT AC  Final   Special Requests   Final    BOTTLES DRAWN AEROBIC AND ANAEROBIC Blood Culture results may not be optimal due to an excessive volume of blood received in culture bottles   Culture   Final    NO GROWTH 1 DAY Performed at [facility], [address] [place], [place] [address]    Report Status PENDING  Incomplete    Procedures and diagnostic studies:  Dg Chest 2 View  Result Date: 02/10/2019 CLINICAL DATA:  Shortness of breath EXAM: CHEST - 2 VIEW COMPARISON:  None. FINDINGS: There are diffuse bilateral pulmonary opacities. The lung volumes are low. There is no pneumothorax. No large pleural effusion. Heart size appears mildly enlarged. IMPRESSION: Diffuse bilateral pulmonary opacities which can be seen in patients with an atypical infectious process such as viral pneumonia. Electronically Signed   By: Constance Holster M.D.   On: 02/10/2019 01:17    Medications:   . guaiFENesin  600 mg Oral BID   And  . dextromethorphan  30 mg Oral BID  . enoxaparin (LOVENOX) injection  40 mg Subcutaneous Q24H  . influenza vac split quadrivalent PF  0.5 mL Intramuscular Tomorrow-1000  . ipratropium  2 puff Inhalation Q4H  . methylPREDNISolone (SOLU-MEDROL) injection  60 mg Intravenous Q12H  . vitamin C  500 mg Oral Daily  . zinc sulfate  220 mg Oral Daily   Continuous Infusions: . sodium chloride 500 mL (02/11/19 1257)  . remdesivir 100 mg in NS 100 mL 100 mg (02/11/19 1258)     LOS: 1 day   Odalis Jordan  [facility]   *Please refer to [email or web address], password [email or web address] to get updated schedule on who will round on this patient, as hospitalists switch teams weekly. If 7PM-7AM, please contact night-coverage at [email or web address], password [email or web address] for any overnight needs.  02/11/2019, 5:07 PM

## 2019-02-11 NOTE — Progress Notes (Signed)
Patient has refused cpap. States trying to breath on machine is hard. Remains on HFNC bubble cannula 12 liters. 92% no distress noted. Reports has a hard time sleeping. Told him to speak with his nurse about such.

## 2019-02-12 DIAGNOSIS — J9601 Acute respiratory failure with hypoxia: Secondary | ICD-10-CM | POA: Diagnosis present

## 2019-02-12 LAB — CBC WITH DIFFERENTIAL/PLATELET
Abs Immature Granulocytes: 0.06 10*3/uL (ref 0.00–0.07)
Basophils Absolute: 0 10*3/uL (ref 0.0–0.1)
Basophils Relative: 0 %
Eosinophils Absolute: 0 10*3/uL (ref 0.0–0.5)
Eosinophils Relative: 0 %
HCT: 40.1 % (ref 39.0–52.0)
Hemoglobin: 13.9 g/dL (ref 13.0–17.0)
Immature Granulocytes: 1 %
Lymphocytes Relative: 9 %
Lymphs Abs: 0.7 10*3/uL (ref 0.7–4.0)
MCH: 29.7 pg (ref 26.0–34.0)
MCHC: 34.7 g/dL (ref 30.0–36.0)
MCV: 85.7 fL (ref 80.0–100.0)
Monocytes Absolute: 0.6 10*3/uL (ref 0.1–1.0)
Monocytes Relative: 8 %
Neutro Abs: 6.6 10*3/uL (ref 1.7–7.7)
Neutrophils Relative %: 82 %
Platelets: 274 10*3/uL (ref 150–400)
RBC: 4.68 MIL/uL (ref 4.22–5.81)
RDW: 13.4 % (ref 11.5–15.5)
Smear Review: NORMAL
WBC: 8 10*3/uL (ref 4.0–10.5)
nRBC: 0 % (ref 0.0–0.2)

## 2019-02-12 LAB — FERRITIN: Ferritin: 407 ng/mL — ABNORMAL HIGH (ref 24–336)

## 2019-02-12 LAB — COMPREHENSIVE METABOLIC PANEL
ALT: 91 U/L — ABNORMAL HIGH (ref 0–44)
AST: 49 U/L — ABNORMAL HIGH (ref 15–41)
Albumin: 3.2 g/dL — ABNORMAL LOW (ref 3.5–5.0)
Alkaline Phosphatase: 60 U/L (ref 38–126)
Anion gap: 11 (ref 5–15)
BUN: 18 mg/dL (ref 6–20)
CO2: 25 mmol/L (ref 22–32)
Calcium: 8.5 mg/dL — ABNORMAL LOW (ref 8.9–10.3)
Chloride: 103 mmol/L (ref 98–111)
Creatinine, Ser: 0.82 mg/dL (ref 0.61–1.24)
GFR calc Af Amer: >60 mL/min (ref >60)
GFR calc non Af Amer: >60 mL/min (ref >60)
Glucose, Bld: 160 mg/dL — ABNORMAL HIGH (ref 70–99)
Potassium: 4.1 mmol/L (ref 3.5–5.1)
Sodium: 139 mmol/L (ref 135–145)
Total Bilirubin: 0.8 mg/dL (ref 0.3–1.2)
Total Protein: 7.1 g/dL (ref 6.5–8.1)

## 2019-02-12 LAB — C-REACTIVE PROTEIN: CRP: 1.3 mg/dL — ABNORMAL HIGH (ref <1.0)

## 2019-02-12 LAB — MAGNESIUM: Magnesium: 2.4 mg/dL (ref 1.7–2.4)

## 2019-02-12 LAB — FIBRIN DERIVATIVES D-DIMER (ARMC ONLY): Fibrin derivatives D-dimer (ARMC): 470.09 ng/mL (FEU) (ref 0.00–499.00)

## 2019-02-12 MED ORDER — TRAZODONE HCL 50 MG PO TABS
50.0000 mg | ORAL_TABLET | Freq: Every day | ORAL | Status: DC
  Administered 2019-02-12: 50 mg via ORAL
  Filled 2019-02-12: qty 1

## 2019-02-12 MED ORDER — ENOXAPARIN SODIUM 40 MG/0.4ML ~~LOC~~ SOLN
40.0000 mg | Freq: Two times a day (BID) | SUBCUTANEOUS | Status: DC
  Administered 2019-02-12 – 2019-02-13 (×2): 40 mg via SUBCUTANEOUS
  Filled 2019-02-12 (×2): qty 0.4

## 2019-02-12 NOTE — TOC Initial Note (Signed)
Transition of Care [facility]) - Initial/Assessment Note    Patient Details  Name: Walter Oliver MRN: [id number] Date of Birth: February 03, 1971  Transition [facility] Care [facility]) CM/SW Contact:    Trenton Founds, RN Phone Number: 02/12/2019, 10:48 AM  Clinical Narrative:                  RNCM assessment completed via phone due to +Covid diagnosis.   Patient consult received due to possible medication needs.   Patient answered phone quickly and was engaged in phone conversation. Patient lives alone in single family home with wife and kids, does not work, does drive but anticipates family will pick him up at discharge.   Discussed with patient any medication needs however patient reports he does not take any regular medications and feels he will be able to pick up and afford any possible discharge medications phoned into pharmacy.   Patient reports he does not have a PCP at this time and does not express interest in one, however RNCM did provide information for low income clinics.   Patient is currently on oxygen if he is discharged on oxygen will likely need assistance to obtain charity oxygen or possibly have to self pay.   Expected Discharge Plan: Home/Self Care Barriers to Discharge: Inadequate or no insurance   Patient Goals and CMS Choice Patient states their goals for this hospitalization and ongoing recovery are:: to get better and go home      Expected Discharge Plan and Services Expected Discharge Plan: Home/Self Care       Living arrangements for the past 2 months: Single Family Home                                      Prior Living Arrangements/Services Living arrangements for the past 2 months: Single Family Home Lives with:: Spouse, Minor Children Patient language and need for interpreter reviewed:: Yes Do you feel safe going back to the place where you live?: Yes      Need for Family Participation in Patient Care: No (Comment) Care giver support system in place?:  Yes (comment)   Criminal Activity/Legal Involvement Pertinent to Current Situation/Hospitalization: No - Comment as needed  Activities of Daily Living Home Assistive Devices/Equipment: None ADL Screening (condition at time of admission) Patient's cognitive ability adequate to safely complete daily activities?: Yes Is the patient deaf or have difficulty hearing?: No Does the patient have difficulty seeing, even when wearing glasses/contacts?: No Does the patient have difficulty concentrating, remembering, or making decisions?: No Patient able to express need for assistance with ADLs?: Yes Does the patient have difficulty dressing or bathing?: No Independently performs ADLs?: Yes (appropriate for developmental age) Does the patient have difficulty walking or climbing stairs?: No Weakness of Legs: None Weakness of Arms/Hands: None  Permission Sought/Granted                  Emotional Assessment Appearance:: (Unable to perform in person assessment due to positive Covid diagnosis) Attitude/Demeanor/Rapport: Engaged   Orientation: : Oriented to Self, Oriented to Place, Oriented to  Time, Oriented to Situation   Psych Involvement: No (comment)  Admission diagnosis:  Hypoxia [R09.02] Pneumonia due to COVID-19 virus [U07.1, J12.89] Acute respiratory disease due to COVID-19 virus [U07.1, J06.9] Patient Active Problem List   Diagnosis Date Noted  . Sepsis (HCC) 02/11/2019  . Pneumonia due to COVID-19 virus 02/11/2019  . Acute respiratory disease  due to COVID-19 virus 02/10/2019   PCP:  Patient, No Pcp Per Pharmacy:  No Pharmacies Listed    Social Determinants of Health (SDOH) Interventions    Readmission Risk Interventions No flowsheet data found.

## 2019-02-12 NOTE — Plan of Care (Signed)
Patient's oxygen saturation has improved tonight. He is between 92-94% on 15L HFNC. Patient is still tachypneic with respirations ranging from 28-40.  Heart rate and blood pressure are within normal limits. Patient is afebrile. He was also given tylenol for body aches. Encouraged patient to take slow, deep breaths and try to get some rest.  Will continue to monitor.  Christene Slates  02/12/2019  5:24 AM

## 2019-02-12 NOTE — Progress Notes (Addendum)
Progress Note    Lyndon Chapel  ASN:[id number] DOB: 12-Oct-1970  DOA: 02/10/2019 PCP: Patient, No Pcp Per          Assessment/Plan:   Principal Problem:   Pneumonia due to COVID-19 virus Active Problems:   Acute respiratory disease due to COVID-19 virus   Sepsis (HCC)   Acute hypoxemic respiratory failure (HCC)   Body mass index is 41.96 kg/m.    Sepsis secondary to COVID-19 pneumonia: Continue IV remdesivir and dexamethasone.  Acute hypoxemic respiratory failure: He is on high flow nasal cannula requiring up to 15 L/min.  Taper off oxygen as able.  He is at high risk for decompensation.  He has been informed that he is requiring high amount of oxygen and he is not ready for discharge anytime soon.  Ordered trazodone for insomnia.  Family Communication/Anticipated D/C date and plan/Code Status   DVT prophylaxis: Lovenox Code Status: Full code Family Communication: None  disposition Plan: To be determined     Subjective:   C/o cough and shortness of breath.  He wants to know when he can be discharged home.  Objective:    Vitals:   02/11/19 2244 02/12/19 0454 02/12/19 1208 02/12/19 1300  BP:  129/72 (!) 144/75   Pulse: 88 91 98   Resp: (!) 32 (!) 28 (!) 22 20  Temp:  98.2 F (36.8 C) 98.2 F (36.8 C)   TempSrc:  Oral Oral   SpO2: 91% 93% (!) 88% 93%  Weight:      Height:        Intake/Output Summary (Last 24 hours) at 02/12/2019 1438 Last data filed at 02/12/2019 0454 Gross per 24 hour  Intake 351.45 ml  Output 450 ml  Net -98.55 ml   Filed Weights   02/10/19 0047 02/10/19 1622  Weight: 117.9 kg 117.9 kg    Exam:  GEN: NAD SKIN: No rash EYES: EOMI ENT: MMM CV: RRR PULM: b/l rhonchi anteriorly, no rales heard ABD: soft, ND, NT, +BS CNS: AAO x 3, non focal EXT: No edema or tenderness    Data Reviewed:   I have personally reviewed following labs and imaging studies:  Labs: Labs show the following:   Basic Metabolic  Panel: Recent Labs  Lab 02/10/19 0051 02/11/19 0606 02/12/19 0556  NA 132* 137 139  K 3.7 4.2 4.1  CL 102 103 103  CO2 22 24 25   GLUCOSE 157* 184* 160*  BUN 11 14 18   CREATININE 1.02 0.74 0.82  CALCIUM 8.1* 8.4* 8.5*  MG  --  2.3 2.4   GFR Estimated Creatinine Clearance: 134.5 mL/min (by C-G formula based on SCr of 0.82 mg/dL). Liver Function Tests: Recent Labs  Lab 02/10/19 0051 02/11/19 0606 02/12/19 0556  AST 61* 53* 49*  ALT 105* 97* 91*  ALKPHOS 71 63 60  BILITOT 0.5 0.7 0.8  PROT 7.2 7.2 7.1  ALBUMIN 3.6 3.3* 3.2*   No results for input(s): LIPASE, AMYLASE in the last 168 hours. No results for input(s): AMMONIA in the last 168 hours. Coagulation profile No results for input(s): INR, PROTIME in the last 168 hours.  CBC: Recent Labs  Lab 02/10/19 0051 02/11/19 0606 02/12/19 0556  WBC 4.9 5.7 8.0  NEUTROABS 3.3 4.7 6.6  HGB 14.4 14.0 13.9  HCT 40.8 40.1 40.1  MCV 85.2 85.5 85.7  PLT 180 224 274   Cardiac Enzymes: No results for input(s): CKTOTAL, CKMB, CKMBINDEX, TROPONINI in the last 168 hours. BNP (last 3  results) No results for input(s): PROBNP in the last 8760 hours. CBG: No results for input(s): GLUCAP in the last 168 hours. D-Dimer: No results for input(s): DDIMER in the last 72 hours. Hgb A1c: No results for input(s): HGBA1C in the last 72 hours. Lipid Profile: Recent Labs    02/10/19 0424  TRIG 72   Thyroid function studies: No results for input(s): TSH, T4TOTAL, T3FREE, THYROIDAB in the last 72 hours.  Invalid input(s): FREET3 Anemia work up: Recent Labs    02/11/19 0606 02/12/19 0556  FERRITIN 455* 407*   Sepsis Labs: Recent Labs  Lab 02/10/19 0051 02/10/19 0424 02/10/19 0822 02/11/19 0606 02/12/19 0556  PROCALCITON  --  0.10  --   --   --   WBC 4.9  --   --  5.7 8.0  LATICACIDVEN  --  1.3 1.2  --   --     Microbiology Recent Results (from the past 240 hour(s))  Blood Culture (routine x 2)     Status: None  (Preliminary result)   Collection Time: 02/10/19  4:24 AM   Specimen: BLOOD  Result Value Ref Range Status   Specimen Description BLOOD LEFT FA  Final   Special Requests   Final    BOTTLES DRAWN AEROBIC AND ANAEROBIC Blood Culture adequate volume   Culture   Final    NO GROWTH 2 DAYS Performed at [facility], [address], [place], [place] [address]    Report Status PENDING  Incomplete  Blood Culture (routine x 2)     Status: None (Preliminary result)   Collection Time: 02/10/19  4:24 AM   Specimen: BLOOD  Result Value Ref Range Status   Specimen Description BLOOD RIGHT [facility]  Final   Special Requests   Final    BOTTLES DRAWN AEROBIC AND ANAEROBIC Blood Culture results may not be optimal due to an excessive volume of blood received in culture bottles   Culture   Final    NO GROWTH 2 DAYS Performed at [facility], [address], [place], [place] [address]    Report Status PENDING  Incomplete    Procedures and diagnostic studies:  No results found.  Medications:   . guaiFENesin  600 mg Oral BID   And  . dextromethorphan  30 mg Oral BID  . enoxaparin (LOVENOX) injection  40 mg Subcutaneous Q12H  . influenza vac split quadrivalent PF  0.5 mL Intramuscular Tomorrow-1000  . ipratropium  2 puff Inhalation Q4H  . methylPREDNISolone (SOLU-MEDROL) injection  60 mg Intravenous Q12H  . traZODone  50 mg Oral QHS  . vitamin C  500 mg Oral Daily  . zinc sulfate  220 mg Oral Daily   Continuous Infusions: . sodium chloride 30 mL (02/12/19 1006)  . remdesivir 100 mg in NS 100 mL 100 mg (02/12/19 1007)     LOS: 2 days   Karsen Fellows  [facility]   *Please refer to [email or web address], password [email or web address] to get updated schedule on who will round on this patient, as hospitalists switch teams weekly. If 7PM-7AM, please contact night-coverage at [email or web address], password [email or web address] for any overnight needs.  02/12/2019, 2:38 PM

## 2019-02-12 NOTE — Progress Notes (Signed)
Pharmacy Lovenox Dosing  48 y.o. male admitted with Cough and Shortness of Breath . Patient ordered Lovenox 40 mg daily for VTE prophylaxis.   Filed Weights   02/10/19 0047 02/10/19 1622  Weight: 260 lb (117.9 kg) 260 lb (117.9 kg)    Body mass index is 41.96 kg/m.  Estimated Creatinine Clearance: 134.5 mL/min (by C-G formula based on SCr of 0.82 mg/dL).  Will adjust Lovenox dosing to 40 mg Q12 hours.   Walter Oliver 02/12/2019 2:15 PM

## 2019-02-13 ENCOUNTER — Inpatient Hospital Stay (HOSPITAL_COMMUNITY)
Admission: AD | Admit: 2019-02-13 | Discharge: 2019-04-06 | DRG: 207 | Disposition: E | Payer: HRSA Program | Source: Other Acute Inpatient Hospital | Attending: Emergency Medicine | Admitting: Emergency Medicine

## 2019-02-13 ENCOUNTER — Inpatient Hospital Stay (HOSPITAL_COMMUNITY): Payer: HRSA Program

## 2019-02-13 ENCOUNTER — Encounter (HOSPITAL_COMMUNITY): Payer: Self-pay | Admitting: Family Medicine

## 2019-02-13 ENCOUNTER — Other Ambulatory Visit: Payer: Self-pay

## 2019-02-13 DIAGNOSIS — J80 Acute respiratory distress syndrome: Secondary | ICD-10-CM | POA: Diagnosis present

## 2019-02-13 DIAGNOSIS — L89812 Pressure ulcer of head, stage 2: Secondary | ICD-10-CM | POA: Diagnosis not present

## 2019-02-13 DIAGNOSIS — T380X5A Adverse effect of glucocorticoids and synthetic analogues, initial encounter: Secondary | ICD-10-CM | POA: Diagnosis present

## 2019-02-13 DIAGNOSIS — J156 Pneumonia due to other aerobic Gram-negative bacteria: Secondary | ICD-10-CM | POA: Diagnosis not present

## 2019-02-13 DIAGNOSIS — E873 Alkalosis: Secondary | ICD-10-CM | POA: Diagnosis not present

## 2019-02-13 DIAGNOSIS — Z7289 Other problems related to lifestyle: Secondary | ICD-10-CM

## 2019-02-13 DIAGNOSIS — A419 Sepsis, unspecified organism: Secondary | ICD-10-CM | POA: Diagnosis not present

## 2019-02-13 DIAGNOSIS — E870 Hyperosmolality and hypernatremia: Secondary | ICD-10-CM | POA: Diagnosis not present

## 2019-02-13 DIAGNOSIS — J9601 Acute respiratory failure with hypoxia: Secondary | ICD-10-CM | POA: Diagnosis not present

## 2019-02-13 DIAGNOSIS — J9382 Other air leak: Secondary | ICD-10-CM | POA: Diagnosis not present

## 2019-02-13 DIAGNOSIS — Z9911 Dependence on respirator [ventilator] status: Secondary | ICD-10-CM | POA: Diagnosis not present

## 2019-02-13 DIAGNOSIS — J15211 Pneumonia due to Methicillin susceptible Staphylococcus aureus: Secondary | ICD-10-CM | POA: Diagnosis not present

## 2019-02-13 DIAGNOSIS — A4101 Sepsis due to Methicillin susceptible Staphylococcus aureus: Secondary | ICD-10-CM | POA: Diagnosis not present

## 2019-02-13 DIAGNOSIS — A4159 Other Gram-negative sepsis: Secondary | ICD-10-CM | POA: Diagnosis not present

## 2019-02-13 DIAGNOSIS — F419 Anxiety disorder, unspecified: Secondary | ICD-10-CM | POA: Diagnosis present

## 2019-02-13 DIAGNOSIS — Y95 Nosocomial condition: Secondary | ICD-10-CM | POA: Diagnosis not present

## 2019-02-13 DIAGNOSIS — R6521 Severe sepsis with septic shock: Secondary | ICD-10-CM | POA: Diagnosis not present

## 2019-02-13 DIAGNOSIS — Y92239 Unspecified place in hospital as the place of occurrence of the external cause: Secondary | ICD-10-CM | POA: Diagnosis present

## 2019-02-13 DIAGNOSIS — K5900 Constipation, unspecified: Secondary | ICD-10-CM | POA: Diagnosis not present

## 2019-02-13 DIAGNOSIS — J14 Pneumonia due to Hemophilus influenzae: Secondary | ICD-10-CM | POA: Diagnosis not present

## 2019-02-13 DIAGNOSIS — E781 Pure hyperglyceridemia: Secondary | ICD-10-CM | POA: Diagnosis present

## 2019-02-13 DIAGNOSIS — Z9189 Other specified personal risk factors, not elsewhere classified: Secondary | ICD-10-CM

## 2019-02-13 DIAGNOSIS — Z6841 Body Mass Index (BMI) 40.0 and over, adult: Secondary | ICD-10-CM

## 2019-02-13 DIAGNOSIS — G9341 Metabolic encephalopathy: Secondary | ICD-10-CM | POA: Diagnosis not present

## 2019-02-13 DIAGNOSIS — J1289 Other viral pneumonia: Secondary | ICD-10-CM | POA: Diagnosis not present

## 2019-02-13 DIAGNOSIS — E1165 Type 2 diabetes mellitus with hyperglycemia: Secondary | ICD-10-CM | POA: Diagnosis not present

## 2019-02-13 DIAGNOSIS — I469 Cardiac arrest, cause unspecified: Secondary | ICD-10-CM | POA: Diagnosis not present

## 2019-02-13 DIAGNOSIS — R0902 Hypoxemia: Secondary | ICD-10-CM

## 2019-02-13 DIAGNOSIS — J40 Bronchitis, not specified as acute or chronic: Secondary | ICD-10-CM | POA: Diagnosis not present

## 2019-02-13 DIAGNOSIS — Z4659 Encounter for fitting and adjustment of other gastrointestinal appliance and device: Secondary | ICD-10-CM

## 2019-02-13 DIAGNOSIS — J1282 Pneumonia due to coronavirus disease 2019: Secondary | ICD-10-CM | POA: Diagnosis present

## 2019-02-13 DIAGNOSIS — U071 COVID-19: Secondary | ICD-10-CM | POA: Diagnosis present

## 2019-02-13 DIAGNOSIS — E875 Hyperkalemia: Secondary | ICD-10-CM | POA: Diagnosis not present

## 2019-02-13 DIAGNOSIS — I10 Essential (primary) hypertension: Secondary | ICD-10-CM | POA: Diagnosis present

## 2019-02-13 DIAGNOSIS — R197 Diarrhea, unspecified: Secondary | ICD-10-CM | POA: Diagnosis not present

## 2019-02-13 DIAGNOSIS — R7989 Other specified abnormal findings of blood chemistry: Secondary | ICD-10-CM | POA: Diagnosis present

## 2019-02-13 DIAGNOSIS — R000 Tachycardia, unspecified: Secondary | ICD-10-CM | POA: Diagnosis present

## 2019-02-13 DIAGNOSIS — E876 Hypokalemia: Secondary | ICD-10-CM | POA: Diagnosis not present

## 2019-02-13 DIAGNOSIS — R7401 Elevation of levels of liver transaminase levels: Secondary | ICD-10-CM | POA: Diagnosis not present

## 2019-02-13 DIAGNOSIS — E871 Hypo-osmolality and hyponatremia: Secondary | ICD-10-CM | POA: Diagnosis not present

## 2019-02-13 DIAGNOSIS — E66813 Obesity, class 3: Secondary | ICD-10-CM | POA: Diagnosis present

## 2019-02-13 DIAGNOSIS — L8990 Pressure ulcer of unspecified site, unspecified stage: Secondary | ICD-10-CM | POA: Diagnosis not present

## 2019-02-13 DIAGNOSIS — Z978 Presence of other specified devices: Secondary | ICD-10-CM

## 2019-02-13 LAB — COMPREHENSIVE METABOLIC PANEL
ALT: 93 U/L — ABNORMAL HIGH (ref 0–44)
AST: 43 U/L — ABNORMAL HIGH (ref 15–41)
Albumin: 3.2 g/dL — ABNORMAL LOW (ref 3.5–5.0)
Alkaline Phosphatase: 67 U/L (ref 38–126)
Anion gap: 11 (ref 5–15)
BUN: 17 mg/dL (ref 6–20)
CO2: 25 mmol/L (ref 22–32)
Calcium: 8.4 mg/dL — ABNORMAL LOW (ref 8.9–10.3)
Chloride: 101 mmol/L (ref 98–111)
Creatinine, Ser: 1.03 mg/dL (ref 0.61–1.24)
GFR calc Af Amer: >60 mL/min (ref >60)
GFR calc non Af Amer: >60 mL/min (ref >60)
Glucose, Bld: 188 mg/dL — ABNORMAL HIGH (ref 70–99)
Potassium: 4.3 mmol/L (ref 3.5–5.1)
Sodium: 137 mmol/L (ref 135–145)
Total Bilirubin: 1 mg/dL (ref 0.3–1.2)
Total Protein: 7.3 g/dL (ref 6.5–8.1)

## 2019-02-13 LAB — MRSA PCR SCREENING: MRSA by PCR: NEGATIVE

## 2019-02-13 LAB — HEMOGLOBIN A1C
Hgb A1c MFr Bld: 6.7 % — ABNORMAL HIGH (ref 4.8–5.6)
Mean Plasma Glucose: 145.59 mg/dL

## 2019-02-13 LAB — CBC WITH DIFFERENTIAL/PLATELET
Abs Immature Granulocytes: 0.11 10*3/uL — ABNORMAL HIGH (ref 0.00–0.07)
Basophils Absolute: 0 10*3/uL (ref 0.0–0.1)
Basophils Relative: 0 %
Eosinophils Absolute: 0 10*3/uL (ref 0.0–0.5)
Eosinophils Relative: 0 %
HCT: 42 % (ref 39.0–52.0)
Hemoglobin: 14.2 g/dL (ref 13.0–17.0)
Immature Granulocytes: 1 %
Lymphocytes Relative: 6 %
Lymphs Abs: 0.6 10*3/uL — ABNORMAL LOW (ref 0.7–4.0)
MCH: 29.9 pg (ref 26.0–34.0)
MCHC: 33.8 g/dL (ref 30.0–36.0)
MCV: 88.4 fL (ref 80.0–100.0)
Monocytes Absolute: 0.6 10*3/uL (ref 0.1–1.0)
Monocytes Relative: 5 %
Neutro Abs: 9.6 10*3/uL — ABNORMAL HIGH (ref 1.7–7.7)
Neutrophils Relative %: 88 %
Platelets: 323 10*3/uL (ref 150–400)
RBC: 4.75 MIL/uL (ref 4.22–5.81)
RDW: 13.4 % (ref 11.5–15.5)
WBC: 10.9 10*3/uL — ABNORMAL HIGH (ref 4.0–10.5)
nRBC: 0 % (ref 0.0–0.2)

## 2019-02-13 LAB — PROCALCITONIN: Procalcitonin: 0.21 ng/mL

## 2019-02-13 LAB — FERRITIN: Ferritin: 416 ng/mL — ABNORMAL HIGH (ref 24–336)

## 2019-02-13 LAB — D-DIMER, QUANTITATIVE: D-Dimer, Quant: 0.85 ug/mL-FEU — ABNORMAL HIGH (ref 0.00–0.50)

## 2019-02-13 LAB — MAGNESIUM: Magnesium: 2.3 mg/dL (ref 1.7–2.4)

## 2019-02-13 LAB — FIBRIN DERIVATIVES D-DIMER (ARMC ONLY): Fibrin derivatives D-dimer (ARMC): 546.37 ng/mL (FEU) — ABNORMAL HIGH (ref 0.00–499.00)

## 2019-02-13 LAB — C-REACTIVE PROTEIN: CRP: 6.3 mg/dL — ABNORMAL HIGH (ref <1.0)

## 2019-02-13 LAB — BRAIN NATRIURETIC PEPTIDE: B Natriuretic Peptide: 30.7 pg/mL (ref 0.0–100.0)

## 2019-02-13 LAB — TROPONIN I (HIGH SENSITIVITY): Troponin I (High Sensitivity): 2 ng/L (ref <18)

## 2019-02-13 MED ORDER — ONDANSETRON HCL 4 MG PO TABS: 4.0000 mg | ORAL_TABLET | Freq: Four times a day (QID) | ORAL | Status: DC | PRN

## 2019-02-13 MED ORDER — HYDROCOD POLST-CPM POLST ER 10-8 MG/5ML PO SUER
5.0000 mL | Freq: Two times a day (BID) | ORAL | Status: DC | PRN
  Administered 2019-02-14: 5 mL via ORAL
  Filled 2019-02-13: qty 5

## 2019-02-13 MED ORDER — ENOXAPARIN SODIUM 60 MG/0.6ML ~~LOC~~ SOLN
0.5000 mg/kg | Freq: Two times a day (BID) | SUBCUTANEOUS | Status: DC
  Administered 2019-02-13 – 2019-02-15 (×4): 60 mg via SUBCUTANEOUS
  Filled 2019-02-13 (×4): qty 0.6

## 2019-02-13 MED ORDER — SODIUM CHLORIDE 0.9% FLUSH
3.0000 mL | Freq: Two times a day (BID) | INTRAVENOUS | Status: DC
  Administered 2019-02-13 – 2019-02-24 (×17): 3 mL via INTRAVENOUS
  Administered 2019-02-24: 10 mL via INTRAVENOUS
  Administered 2019-02-25 – 2019-03-07 (×20): 3 mL via INTRAVENOUS

## 2019-02-13 MED ORDER — ALBUTEROL SULFATE HFA 108 (90 BASE) MCG/ACT IN AERS
2.0000 | INHALATION_SPRAY | Freq: Four times a day (QID) | RESPIRATORY_TRACT | Status: DC | PRN
  Administered 2019-02-14: 09:00:00 2 via RESPIRATORY_TRACT
  Filled 2019-02-13: qty 6.7

## 2019-02-13 MED ORDER — ZINC SULFATE 220 (50 ZN) MG PO CAPS
220.0000 mg | ORAL_CAPSULE | Freq: Every day | ORAL | Status: DC
  Administered 2019-02-14 – 2019-02-19 (×6): 220 mg via ORAL
  Filled 2019-02-13 (×7): qty 1

## 2019-02-13 MED ORDER — GUAIFENESIN-DM 100-10 MG/5ML PO SYRP
10.0000 mL | ORAL_SOLUTION | ORAL | Status: DC | PRN
  Administered 2019-02-14: 20:00:00 10 mL via ORAL
  Filled 2019-02-13: qty 10

## 2019-02-13 MED ORDER — SODIUM CHLORIDE 0.9% IV SOLUTION
Freq: Once | INTRAVENOUS | Status: AC
  Administered 2019-02-16: 19:00:00 via INTRAVENOUS

## 2019-02-13 MED ORDER — SODIUM CHLORIDE 0.9 % IV SOLN
100.0000 mg | Freq: Every day | INTRAVENOUS | Status: AC
  Administered 2019-02-14: 08:00:00 100 mg via INTRAVENOUS
  Filled 2019-02-13: qty 20

## 2019-02-13 MED ORDER — METHYLPREDNISOLONE SODIUM SUCC 125 MG IJ SOLR
80.0000 mg | Freq: Two times a day (BID) | INTRAMUSCULAR | Status: DC
  Administered 2019-02-13 – 2019-02-20 (×14): 80 mg via INTRAVENOUS
  Filled 2019-02-13 (×14): qty 2

## 2019-02-13 MED ORDER — VITAMIN C 250 MG PO TABS
500.0000 mg | ORAL_TABLET | Freq: Every day | ORAL | Status: DC
  Administered 2019-02-14 – 2019-02-19 (×6): 500 mg via ORAL
  Filled 2019-02-13 (×3): qty 2
  Filled 2019-02-13: qty 1
  Filled 2019-02-13: qty 2
  Filled 2019-02-13: qty 1
  Filled 2019-02-13: qty 2

## 2019-02-13 MED ORDER — ONDANSETRON HCL 4 MG/2ML IJ SOLN: 4.0000 mg | Freq: Four times a day (QID) | INTRAMUSCULAR | Status: DC | PRN

## 2019-02-13 MED ORDER — ACETAMINOPHEN 325 MG PO TABS
650.0000 mg | ORAL_TABLET | Freq: Four times a day (QID) | ORAL | Status: DC | PRN
  Administered 2019-02-14 – 2019-03-02 (×4): 650 mg via ORAL
  Filled 2019-02-13 (×4): qty 2

## 2019-02-13 NOTE — Progress Notes (Signed)
Placed patient back on non rebreather for transport. Tolerating well at the time.

## 2019-02-13 NOTE — Discharge Summary (Signed)
Physician Discharge Summary  Sipriano Fendley STM:[id number] DOB: 1970/12/21 DOA: 02/10/2019  PCP: Patient, No Pcp Per  Admit date: 02/10/2019 Discharge date: 2019/03/06  Discharge disposition: Transfer to [facility]   Recommendations for Outpatient Follow-Up:    Outpatient follow-up with PCP after discharge from the hospital  Discharge Diagnosis:   Principal Problem:   Pneumonia due to COVID-19 virus Active Problems:   Acute respiratory disease due to COVID-19 virus   Sepsis (HCC)   Acute hypoxemic respiratory failure (HCC)    Discharge Condition: Stable.  Diet recommendation: Regular diet  Code status: Full code    Hospital Course:   Mr. Heggs is a 48 year old man with no significant medical history who presented to the hospital with increasing shortness of breath, fever and chills.  Apparently, patient had a positive coronavirus test on 02/07/2019.  In the emergency room, repeat coronavirus test was positive and his chest x-ray showed bilateral infiltrates consistent with pneumonia.  He was admitted to the hospital for acute hypoxemic respiratory failure secondary to COVID-19 pneumonia.  He was treated with IV remdesivir and IV steroids (4 days of treatment thus far).  He was requiring 15 L/min of oxygen via nasal cannula and his oxygen saturation was mostly between 90 to 91%.  Today, his oxygen saturation dropped to 84% on 15 L/min oxygen.  He was placed on high flow nasal cannula 55 L and 70% FiO2.  Oxygen saturation is 91% on the settings.  Decision was made to transfer the patient to COVID-19 unit at [facility] for further management.      Discharge Exam:   Vitals:   03-06-19 1155 2019-03-06 1156  BP: 137/82   Pulse:    Resp:    Temp:    SpO2: 91% 91%   Vitals:   2019-03-06 0545 03-06-2019 1138 03/06/19 1155 03/06/19 1156  BP: 140/86 (!) 147/83 137/82   Pulse: (!) 107 (!) 102    Resp: 20 20    Temp: 98.3 F (36.8 C) 98.5 F (36.9  C)    TempSrc: Oral Oral    SpO2: 90% (!) 84% 91% 91%  Weight:      Height:         GEN: NAD SKIN: No rash EYES: EOMI ENT: MMM CV: RRR PULM: Adequate air entry bilaterally, bibasilar rales, no wheezing ABD: soft, ND, NT, +BS CNS: AAO x 3, non focal EXT: No edema or tenderness   The results of significant diagnostics from this hospitalization (including imaging, microbiology, ancillary and laboratory) are listed below for reference.     Procedures and Diagnostic Studies:   DG Chest 2 View  Result Date: 02/10/2019 CLINICAL DATA:  Shortness of breath EXAM: CHEST - 2 VIEW COMPARISON:  None. FINDINGS: There are diffuse bilateral pulmonary opacities. The lung volumes are low. There is no pneumothorax. No large pleural effusion. Heart size appears mildly enlarged. IMPRESSION: Diffuse bilateral pulmonary opacities which can be seen in patients with an atypical infectious process such as viral pneumonia. Electronically Signed   By: Katherine Mantle M.D.   On: 02/10/2019 01:17     Labs:   Basic Metabolic Panel: Recent Labs  Lab 02/10/19 0051 02/11/19 0606 02/12/19 0556 03-06-19 0503  NA 132* 137 139 137  K 3.7 4.2 4.1 4.3  CL 102 103 103 101  CO2 22 24 25 25   GLUCOSE 157* 184* 160* 188*  BUN 11 14 18 17   CREATININE 1.02 0.74 0.82 1.03  CALCIUM 8.1* 8.4* 8.5* 8.4*  MG  --  2.3 2.4 2.3   GFR Estimated Creatinine Clearance: 107.1 mL/min (by C-G formula based on SCr of 1.03 mg/dL). Liver Function Tests: Recent Labs  Lab 02/10/19 0051 02/11/19 0606 02/12/19 0556 03-13-19 0503  AST 61* 53* 49* 43*  ALT 105* 97* 91* 93*  ALKPHOS 71 63 60 67  BILITOT 0.5 0.7 0.8 1.0  PROT 7.2 7.2 7.1 7.3  ALBUMIN 3.6 3.3* 3.2* 3.2*   No results for input(s): LIPASE, AMYLASE in the last 168 hours. No results for input(s): AMMONIA in the last 168 hours. Coagulation profile No results for input(s): INR, PROTIME in the last 168 hours.  CBC: Recent Labs  Lab 02/10/19 0051  02/11/19 0606 02/12/19 0556 2019-03-13 0503  WBC 4.9 5.7 8.0 10.9*  NEUTROABS 3.3 4.7 6.6 9.6*  HGB 14.4 14.0 13.9 14.2  HCT 40.8 40.1 40.1 42.0  MCV 85.2 85.5 85.7 88.4  PLT 180 224 274 323   Cardiac Enzymes: No results for input(s): CKTOTAL, CKMB, CKMBINDEX, TROPONINI in the last 168 hours. BNP: Invalid input(s): POCBNP CBG: No results for input(s): GLUCAP in the last 168 hours. D-Dimer No results for input(s): DDIMER in the last 72 hours. Hgb A1c No results for input(s): HGBA1C in the last 72 hours. Lipid Profile No results for input(s): CHOL, HDL, LDLCALC, TRIG, CHOLHDL, LDLDIRECT in the last 72 hours. Thyroid function studies No results for input(s): TSH, T4TOTAL, T3FREE, THYROIDAB in the last 72 hours.  Invalid input(s): FREET3 Anemia work up Recent Labs    02/12/19 0556 Mar 13, 2019 0503  FERRITIN 407* 416*   Microbiology Recent Results (from the past 240 hour(s))  Blood Culture (routine x 2)     Status: None (Preliminary result)   Collection Time: 02/10/19  4:24 AM   Specimen: BLOOD  Result Value Ref Range Status   Specimen Description BLOOD LEFT FA  Final   Special Requests   Final    BOTTLES DRAWN AEROBIC AND ANAEROBIC Blood Culture adequate volume   Culture   Final    NO GROWTH 3 DAYS Performed at [facility], [address], [place], [place] [address]    Report Status PENDING  Incomplete  Blood Culture (routine x 2)     Status: None (Preliminary result)   Collection Time: 02/10/19  4:24 AM   Specimen: BLOOD  Result Value Ref Range Status   Specimen Description BLOOD RIGHT AC  Final   Special Requests   Final    BOTTLES DRAWN AEROBIC AND ANAEROBIC Blood Culture results may not be optimal due to an excessive volume of blood received in culture bottles   Culture   Final    NO GROWTH 3 DAYS Performed at [facility], [address], [place], [place] [address]    Report Status PENDING  Incomplete     Discharge Instructions:     Allergies as of March 13, 2019   No Known Allergies     Medication List    STOP taking these medications   aspirin EC 325 MG tablet         Time coordinating discharge: 33 minutes  Signed:  Tibor Lemmons  [facility] Mar 13, 2019, 3:04 PM

## 2019-02-13 NOTE — Significant Event (Signed)
Rapid Response Event Note  Overview: Time Called: 6256 Arrival Time: 1148 Event Type: Respiratory  Initial Focused Assessment: Drop in SpO2, on arrival 84%, other VSS. Patient denies shortness of breath, increased WOB or chest pain/tightness.  Interventions: Placed on heated HFNC, assisted patient into prone position. MD at bedside.  Plan of Care (if not transferred): SpO2 90's on heated HFNC. MD Ayiku to initiate transfer to [facility]. Spoke with 2C RN about patient being stable on heated HFNC and with plans to transfer to [facility] keep patient in current room pending transfer. Discussed with 2C RN to call ICU charge phone with any questions or concerns about patient. Will intermittently monitor and check on patient pending transfer. All in agreed of plan.  Event Summary: Name of Physician Notified: Dr. Mal Misty at 1145    at    Outcome: Stayed in room and stabalized, Transferred (Comment)(transfer to [facility])  Event End Time: [place]

## 2019-02-13 NOTE — Progress Notes (Signed)
Report given to Colletta [place], Therapist, sports at [facility].  Marry Guan, RN 2019/03/11 3:23 PM

## 2019-02-13 NOTE — Progress Notes (Signed)
Patient anxious, reiterate disease process, what to expect during hospitalization and treatment medications.

## 2019-02-13 NOTE — Progress Notes (Addendum)
Vitals taken at 1614. EMTALA form, medical necessity form, and discharge summary given to [facility] transporter.  Marry Guan, RN 02/17/2019 4:26 PM

## 2019-02-13 NOTE — Progress Notes (Signed)
Remdesivir - Pharmacy Brief Note   O:  ALT: 93 CXR: Diffuse bilateral pulmonary opacities which can be seen in patients with an atypical infectious process such as viral pneumonia. SpO2: % on    A/P:  Remdesivir 200 mg IVPB once followed by 100 mg IVPB daily x 4 days(has 1 day left)  Pearla Dubonnet, PharmD Clinical Pharmacist 02/03/2019 2:58 PM

## 2019-02-13 NOTE — Progress Notes (Signed)
At 1138 patient's oxygen saturation dropped to 84% on 15 liters of oxygen via nasal cannula. Rapid response was initiated. Patient was put on high flow oxygen (55L/min) and placed in prone position, at which point his oxygen saturation increased to 94%. Doctor Mal Misty stated that patient will need to transfer to [facility]. Provided update to [facility] and will continue to assess.   Marry Guan, RN 17-Feb-2019 12:18 PM

## 2019-02-13 NOTE — Progress Notes (Addendum)
Pt transported to room with EMS, report given to RN by EMS, transferred to bed.  CHG bath given, skin assessed with Rolla Plate RN, sheets and gown changed.  RT at bedside placed pt on HHFNC 40L at 100%, SPO2 88-91%.  Informed MD McQuaid of pt arrival.  Will complete admission and assessment shortly. 2019/02/20 Tawanna Sat, RN

## 2019-02-13 NOTE — H&P (Signed)
History and Physical   Walter Oliver CBJ:[id number] DOB: [birth year] DOA: 02/28/2019  Referring MD/NP/PA: Dr. Mal Misty PCP: None Patient coming from: Home by way of [facility]  CC: Shortness of breath, covid-19 pneumonia  HPI: Walter Oliver is a 48 y.o. male with no known medical history who presented to [facility] ED in the early morning of 12/8 with chest pain and shortness of breath associated with cough. He had been around a sick contact about 10 days earlier and developed mild symptoms so was tested on 12/4, found to be covid positive and isolated at home with his wife 2 of his 3 children who were also positive. He had fever, chills, slightly improved with tylenol, felt fatigue, nonproductive cough that was constant, severe, worsening prompting ED visit.   In the ED, CXR demonstrated multifocal pneumonia, SARS-CoV-2 confirmed positive, and he was hypoxic to 74%, improved with 3L O2. WBC 4.9, troponin, PCT, lactic acid all normal. D-dimer 700, LDH 323, ferritin 465. He was admitted and started on remdesivir and steroids, the next day requiring 15L HFNC. On 12/11 the patient's hypoxia and work of breathing continued to worsen and he was transferred to ICU at [facility]. On arrival he's on 55LPM HHF and 100% FiO2, RR  ROS: Denies chest pain, and per HPI. All others reviewed and are negative.  PMSH: Denies any medical or surgical history. Denies history of heart problems, lung problems, blood clots, immunodeficiencies. No hepatitis or tuberculosis. SH: Lives with wife and 3 grown children, works as a Administrator. Does not smoke, drink, or use drugs.  FH: Reviewed with patient and not pertinent.  Meds: None regularly, tylenol prn lately Allergies: NKDA  Exam: BP (!) 156/94   Pulse 97   Temp 98.8 F (37.1 C) (Oral)   Resp (!) 34   SpO2 90%  Gen: Ill-appearing 48 y.o.male in no distress HEENT: Normocephalic, sclerae/conjunctivae clear, PERRL, MMM, posterior oropharynx clear, fair dentition Neck: Neck  supple, no masses appreciated; thyroid not enlarged  Pulm: Tachypneic around 40/min, speaking in short sentences, no accessory muscle use. Crackles bilaterally mostly at bases, no wheezes.  CV: Regular borderline tachycardia, no murmur appreciated; distal pulses intact/symmetric; No LE edema GI: + BS; soft, non-tender, non-distended, no HSM, or hernia GU: No foley Skin: No rashes, wounds, ulcers on visualized skin Neuro: Alert, oriented, no aphasia, no visual field deficits, no sensorimotor deficits x4 extremities.   Psych: Judgment, insight in tact. Mood anxious, affect congruent.  CBC: Recent Labs  Lab 02/10/19 0051 02/11/19 0606 02/12/19 0556 03/01/2019 0503  WBC 4.9 5.7 8.0 10.9*  NEUTROABS 3.3 4.7 6.6 9.6*  HGB 14.4 14.0 13.9 14.2  HCT 40.8 40.1 40.1 42.0  MCV 85.2 85.5 85.7 88.4  PLT 180 224 274 160   Basic Metabolic Panel: Recent Labs  Lab 02/10/19 0051 02/11/19 0606 02/12/19 0556 02/06/2019 0503  NA 132* 137 139 137  K 3.7 4.2 4.1 4.3  CL 102 103 103 101  CO2 22 24 25 25   GLUCOSE 157* 184* 160* 188*  BUN 11 14 18 17   CREATININE 1.02 0.74 0.82 1.03  CALCIUM 8.1* 8.4* 8.5* 8.4*  MG  --  2.3 2.4 2.3   Liver Function Tests: Recent Labs  Lab 02/10/19 0051 02/11/19 0606 02/12/19 0556 02/05/2019 0503  AST 61* 53* 49* 43*  ALT 105* 97* 91* 93*  ALKPHOS 71 63 60 67  BILITOT 0.5 0.7 0.8 1.0  PROT 7.2 7.2 7.1 7.3  ALBUMIN 3.6 3.3* 3.2* 3.2*   Blood cultures 12/8:  NGx3d  CXR: 12/8 on admission personally reviewed. This showed diffuse infiltrates bilaterally.  EKG: Independently reviewed. NSR vent rate 100bpm, normal axis, inverted T waves in III, aVF, no ST elevations.  Assessment/Plan Acute hypoxic respiratory failure due to covid-19 pneumonia: Seems to have progressed despite treatment with remdesivir and steroids.  - Check CXR stat. - Check PCT, CRP, d-dimer, troponin, BNP. Pt is lymphopenic with a rising neutrophilic leukocytosis difficult to interpret in the  setting of steroids, is afebrile.  - Continue remdesivir, final dose tomorrow, consider prolonging course - Continue steroids, will augment dosing given refractory rise in inflammatory markers.  - Consented patient for convalescent plasma. Spanish consent provided and all questions answered.  - If patient is intubated, would recommend tocilizumab - Monitor blood cultures drawn at admission to [facility], NG3D  - Continue airborne, contact precautions. - Intermediate dose lovenox - Encourage awake proning.  - Very high risk for intubation, continue HHF. Appreciate pulmonology evaluation.  - Goals of care were discussed, full code, would desire intubation if needed.  LFT elevation: Mild, likely due to covid.  - Trend  Obesity: BMI 41. Worsens prognosis.  - Check HbA1c  DVT prophylaxis: Lovenox 0.5mg /kg q12h  Code Status: Full  Family Communication: None at bedside Disposition Plan: Remain in ICU Consults called: PCCM, Dr. Kendrick Fries  Admission status: Inpatient    Tyrone Nine, MD [facility] [email or web address] Mar 05, 2019, 6:46 PM

## 2019-02-13 NOTE — Progress Notes (Addendum)
NAME:  Walter Oliver, MRN:  [id number], DOB:  September 03, 1970, LOS: 0 ADMISSION DATE:  February 28, 2019, CONSULTATION DATE:  12/11 REFERRING MD:  Jarvis Newcomer CHIEF COMPLAINT:  dyspnea   Brief History   48 year old male admitted on 12/8 to [facility], then moved to [facility] 02-28-2019 for severe acute respiratory failure with hypoxemia secondary to COVID-19 pneumonia.  History of present illness   This is a 48 year old male who came to [facility] with severe acute respiratory failure with hypoxemia due to COVID-19 pneumonia after he was found to have Covid on 12 5.  He was treated there with steroids and remdesivir and was transferred here for further management.  He was noted to have profoundly worsening hypoxemia on December 11 so he was moved to our facility for further evaluation.  There he has been placed on heated high flow oxygen.  He is breathing quickly but he says that he does not feel short of breath.  He does not have a cough.  He says that he would be willing to go on mechanical ventilation if necessary because he wants to do "anything that is necessary to return to his family ".  Past Medical History  None listed  Significant Hospital Events   12/8 admission 12/11 move to [facility]  Consults:  PCCM  Procedures:    Significant Diagnostic Tests:    Micro Data:  12/5 SARS COV 2 > positive 12/8 blood >   Antimicrobials/COVID Rx  12/7 decadron x1 12/8 methylpred >  12/7 remdesivir >    Interim history/subjective:    Objective   Blood pressure (!) 170/97, pulse 98, temperature 98.8 F (37.1 C), temperature source Oral, resp. rate (!) 32, SpO2 91 %.    FiO2 (%):  [70 %-100 %] 100 %  No intake or output data in the 24 hours ending 2019-02-28 1821 There were no vitals filed for this visit.  Examination:  General:  In bed on vent HENT: NCAT ETT in place PULM: CTA B, vent supported breathing CV: RRR, no mgr GI: BS+, soft, nontender MSK: normal bulk and  tone Neuro: sedated on vent   Resolved Hospital Problem list     Assessment & Plan:  ARDS due to COVID-19 pneumonia: Admit to ICU Get chest x-ray Heated high flow oxygen Careful monitoring in ICU Continue systemic steroids Finish course of remdesivir Monitor carefully for need for intubation/mechanical ventilation Continue mechanical ventilation per ARDS protocol Target TVol 6-8cc/kgIBW Target Plateau Pressure < 30cm H20 Target driving pressure less than 15 cm of water Target PaO2 55-65: titrate PEEP/FiO2 per protocol As long as PaO2 to FiO2 ratio is less than 1:150 position in prone position for 16 hours a day Check CVP daily if CVL in place Target CVP less than 4, diurese as necessary Ventilator associated pneumonia prevention protocol   Best practice:   Per [facility]  Labs   CBC: Recent Labs  Lab 02/10/19 0051 02/11/19 0606 02/12/19 0556 02-28-2019 0503  WBC 4.9 5.7 8.0 10.9*  NEUTROABS 3.3 4.7 6.6 9.6*  HGB 14.4 14.0 13.9 14.2  HCT 40.8 40.1 40.1 42.0  MCV 85.2 85.5 85.7 88.4  PLT 180 224 274 323    Basic Metabolic Panel: Recent Labs  Lab 02/10/19 0051 02/11/19 0606 02/12/19 0556 02/28/2019 0503  NA 132* 137 139 137  K 3.7 4.2 4.1 4.3  CL 102 103 103 101  CO2 22 24 25 25   GLUCOSE 157* 184* 160* 188*  BUN 11 14 18 17   CREATININE  1.02 0.74 0.82 1.03  CALCIUM 8.1* 8.4* 8.5* 8.4*  MG  --  2.3 2.4 2.3   GFR: Estimated Creatinine Clearance: 107.1 mL/min (by C-G formula based on SCr of 1.03 mg/dL). Recent Labs  Lab 02/10/19 0051 02/10/19 0424 02/10/19 0822 02/11/19 0606 02/12/19 0556 Mar 14, 2019 0503  PROCALCITON  --  0.10  --   --   --   --   WBC 4.9  --   --  5.7 8.0 10.9*  LATICACIDVEN  --  1.3 1.2  --   --   --     Liver Function Tests: Recent Labs  Lab 02/10/19 0051 02/11/19 0606 02/12/19 0556 14-Mar-2019 0503  AST 61* 53* 49* 43*  ALT 105* 97* 91* 93*  ALKPHOS 71 63 60 67  BILITOT 0.5 0.7 0.8 1.0  PROT 7.2 7.2 7.1 7.3  ALBUMIN 3.6 3.3*  3.2* 3.2*   No results for input(s): LIPASE, AMYLASE in the last 168 hours. No results for input(s): AMMONIA in the last 168 hours.  ABG No results found for: PHART, PCO2ART, PO2ART, HCO3, TCO2, ACIDBASEDEF, O2SAT   Coagulation Profile: No results for input(s): INR, PROTIME in the last 168 hours.  Cardiac Enzymes: No results for input(s): CKTOTAL, CKMB, CKMBINDEX, TROPONINI in the last 168 hours.  HbA1C: No results found for: HGBA1C  CBG: No results for input(s): GLUCAP in the last 168 hours.  Review of Systems:   Gen: Denies fever, chills, weight change, fatigue, night sweats HEENT: Denies blurred vision, double vision, hearing loss, tinnitus, sinus congestion, rhinorrhea, sore throat, neck stiffness, dysphagia PULM: per HPI CV: Denies chest pain, edema, orthopnea, paroxysmal nocturnal dyspnea, palpitations GI: Denies abdominal pain, nausea, vomiting, diarrhea, hematochezia, melena, constipation, change in bowel habits GU: Denies dysuria, hematuria, polyuria, oliguria, urethral discharge Endocrine: Denies hot or cold intolerance, polyuria, polyphagia or appetite change Derm: Denies rash, dry skin, scaling or peeling skin change Heme: Denies easy bruising, bleeding, bleeding gums Neuro: Denies headache, numbness, weakness, slurred speech, loss of memory or consciousness   Past Medical History  He,  has no past medical history on file.   Surgical History   No past surgical history on file.   Social History   reports that he has never smoked. He has never used smokeless tobacco. He reports that he does not drink alcohol or use drugs.   Family History   His family history is not on file.   Allergies No Known Allergies   Home Medications  Prior to Admission medications   Not on File     Critical care time: 35 minutes    Roselie Awkward, MD [place] [facility] Pager: [phone] Cell: [phone] If no response, call [phone]

## 2019-02-13 NOTE — Progress Notes (Signed)
  Trialed patient on NRB with saturations staying 91-93%. Patient placed back on [place] until ready for transport to [place].

## 2019-02-13 NOTE — Progress Notes (Signed)
Patient changed to [facility] due to low saturations on 15L HFNC. Patient also asked to prone himself to assist in bringing his o2 sats up. With patient prone and on 70% @ 55L his sats were 91-92%. MD aware of changes.

## 2019-02-14 LAB — CBC WITH DIFFERENTIAL/PLATELET
Abs Immature Granulocytes: 0.08 10*3/uL — ABNORMAL HIGH (ref 0.00–0.07)
Basophils Absolute: 0 10*3/uL (ref 0.0–0.1)
Basophils Relative: 0 %
Eosinophils Absolute: 0 10*3/uL (ref 0.0–0.5)
Eosinophils Relative: 0 %
HCT: 40.1 % (ref 39.0–52.0)
Hemoglobin: 13.5 g/dL (ref 13.0–17.0)
Immature Granulocytes: 1 %
Lymphocytes Relative: 6 %
Lymphs Abs: 0.6 10*3/uL — ABNORMAL LOW (ref 0.7–4.0)
MCH: 30 pg (ref 26.0–34.0)
MCHC: 33.7 g/dL (ref 30.0–36.0)
MCV: 89.1 fL (ref 80.0–100.0)
Monocytes Absolute: 0.3 10*3/uL (ref 0.1–1.0)
Monocytes Relative: 3 %
Neutro Abs: 8.9 10*3/uL — ABNORMAL HIGH (ref 1.7–7.7)
Neutrophils Relative %: 90 %
Platelets: 364 10*3/uL (ref 150–400)
RBC: 4.5 MIL/uL (ref 4.22–5.81)
RDW: 13.1 % (ref 11.5–15.5)
WBC: 9.9 10*3/uL (ref 4.0–10.5)
nRBC: 0 % (ref 0.0–0.2)

## 2019-02-14 LAB — COMPREHENSIVE METABOLIC PANEL
ALT: 67 U/L — ABNORMAL HIGH (ref 0–44)
AST: 33 U/L (ref 15–41)
Albumin: 2.8 g/dL — ABNORMAL LOW (ref 3.5–5.0)
Alkaline Phosphatase: 70 U/L (ref 38–126)
Anion gap: 12 (ref 5–15)
BUN: 16 mg/dL (ref 6–20)
CO2: 25 mmol/L (ref 22–32)
Calcium: 8.6 mg/dL — ABNORMAL LOW (ref 8.9–10.3)
Chloride: 100 mmol/L (ref 98–111)
Creatinine, Ser: 0.85 mg/dL (ref 0.61–1.24)
GFR calc Af Amer: >60 mL/min (ref >60)
GFR calc non Af Amer: >60 mL/min (ref >60)
Glucose, Bld: 167 mg/dL — ABNORMAL HIGH (ref 70–99)
Potassium: 4.8 mmol/L (ref 3.5–5.1)
Sodium: 137 mmol/L (ref 135–145)
Total Bilirubin: 0.9 mg/dL (ref 0.3–1.2)
Total Protein: 6.9 g/dL (ref 6.5–8.1)

## 2019-02-14 LAB — GLUCOSE, CAPILLARY
Glucose-Capillary: 158 mg/dL — ABNORMAL HIGH (ref 70–99)
Glucose-Capillary: 181 mg/dL — ABNORMAL HIGH (ref 70–99)
Glucose-Capillary: 144 mg/dL — ABNORMAL HIGH (ref 70–99)
Glucose-Capillary: 174 mg/dL — ABNORMAL HIGH (ref 70–99)

## 2019-02-14 LAB — D-DIMER, QUANTITATIVE: D-Dimer, Quant: 0.96 ug/mL-FEU — ABNORMAL HIGH (ref 0.00–0.50)

## 2019-02-14 LAB — PROCALCITONIN: Procalcitonin: 0.18 ng/mL

## 2019-02-14 LAB — C-REACTIVE PROTEIN: CRP: 9.9 mg/dL — ABNORMAL HIGH (ref <1.0)

## 2019-02-14 LAB — TYPE AND SCREEN
ABO/RH(D): O POS
Antibody Screen: NEGATIVE

## 2019-02-14 LAB — FERRITIN: Ferritin: 334 ng/mL (ref 24–336)

## 2019-02-14 LAB — ABO/RH: ABO/RH(D): O POS

## 2019-02-14 MED ORDER — CHLORHEXIDINE GLUCONATE CLOTH 2 % EX PADS
6.0000 | MEDICATED_PAD | Freq: Every day | CUTANEOUS | Status: DC
  Administered 2019-02-15 – 2019-02-16 (×2): 6 via TOPICAL

## 2019-02-14 MED ORDER — LORAZEPAM 0.5 MG PO TABS
1.0000 mg | ORAL_TABLET | Freq: Four times a day (QID) | ORAL | Status: DC | PRN
  Administered 2019-02-14 – 2019-02-15 (×3): 1 mg via ORAL
  Filled 2019-02-14 (×3): qty 2

## 2019-02-14 MED ORDER — INSULIN ASPART 100 UNIT/ML ~~LOC~~ SOLN: 0.0000 [IU] | Freq: Every day | SUBCUTANEOUS | Status: DC

## 2019-02-14 MED ORDER — SODIUM CHLORIDE 0.9 % IV SOLN
100.0000 mg | Freq: Every day | INTRAVENOUS | Status: AC
  Administered 2019-02-15 – 2019-02-19 (×5): 100 mg via INTRAVENOUS
  Filled 2019-02-14 (×4): qty 20

## 2019-02-14 MED ORDER — ORAL CARE MOUTH RINSE
15.0000 mL | Freq: Two times a day (BID) | OROMUCOSAL | Status: DC
  Administered 2019-02-14 – 2019-02-16 (×5): 15 mL via OROMUCOSAL

## 2019-02-14 MED ORDER — INSULIN ASPART 100 UNIT/ML ~~LOC~~ SOLN
0.0000 [IU] | Freq: Three times a day (TID) | SUBCUTANEOUS | Status: DC
  Administered 2019-02-14 (×2): 2 [IU] via SUBCUTANEOUS
  Administered 2019-02-14 – 2019-02-15 (×3): 1 [IU] via SUBCUTANEOUS
  Administered 2019-02-16: 2 [IU] via SUBCUTANEOUS

## 2019-02-14 NOTE — Progress Notes (Signed)
Patient transferred from bed to [facility] with assistance, patient tolerated activity fair. Patient bathed and linen changed. Spouse called patient and update given via phone. Patient placed back in bed in prone position. NRB removed, patient remains on 50L/min HHFNC with oxygen saturations in low 90's. Will continue to monitor and treat per MD orders.

## 2019-02-14 NOTE — Progress Notes (Signed)
NAME:  Walter Oliver, MRN:  [id number], DOB:  [birth year], LOS: 1 ADMISSION DATE:  02/05/2019, CONSULTATION DATE:  12/11 REFERRING MD:  Jarvis Newcomer CHIEF COMPLAINT:  dyspnea   Brief History   48 year old male admitted on 12/8 to [facility], then moved to [facility] February 13, 2019 for severe acute respiratory failure with hypoxemia secondary to COVID-19 pneumonia.  History of present illness   This is a 48 year old male who came to [facility] with severe acute respiratory failure with hypoxemia due to COVID-19 pneumonia after he was found to have Covid on 12 5.  He was treated there with steroids and remdesivir and was transferred here for further management.  He was noted to have profoundly worsening hypoxemia on December 11 so he was moved to our facility for further evaluation.  There he has been placed on heated high flow oxygen.  He is breathing quickly but he says that he does not feel short of breath.  He does not have a cough.  He says that he would be willing to go on mechanical ventilation if necessary because he wants to do "anything that is necessary to return to his family  Past Medical History  None listed  Significant Hospital Events   12/8 admission 12/11 move to [facility]  Consults:  PCCM  Procedures:    Significant Diagnostic Tests:    Micro Data:  12/5 SARS COV 2 > positive 12/8 blood >   Antimicrobials/COVID Rx  12/7 decadron x1 12/8 methylpred >  12/7 remdesivir >    Interim history/subjective:  No major issues overnight, maintained stability on heated high flow.  Objective   Blood pressure (!) 155/84, pulse 97, temperature 98.2 F (36.8 C), temperature source Oral, resp. rate (!) 29, height 6' (1.829 m), weight 114.8 kg, SpO2 95 %.    FiO2 (%):  [40 %-100 %] 100 %   Intake/Output Summary (Last 24 hours) at 02/14/2019 1037 Last data filed at 02/14/2019 0857 Gross per 24 hour  Intake 1617.36 ml  Output 800 ml  Net 817.36 ml   Filed Weights   02/17/2019 1730  Weight: 114.8 kg    Examination:  General: comfortable, resting in bed, alert, talkative  HENT: NCAT, tracking appropriately  PULM: BL breaths, nonlabored, tachypneic CV: Tachycardic rate and rhythm, S1-S2  GI: Soft nontender nondistended MSK: Normal bulk and tone Neuro: Awake alert moving all 4 extremities following commands  Resolved Hospital Problem list     Assessment & Plan:   ARDS due to COVID-19 pneumonia: Remains on HHFNC Continue to observe in ICU for close hemodynamic and respiratory support At risk for need for mechanical support Decadron + remdesivir  Continue mechanical ventilation per ARDS protocol Target TVol 6-8cc/kgIBW Target Plateau Pressure < 30cm H20 Target driving pressure less than 15 cm of water Target PaO2 55-65: titrate PEEP/FiO2 per protocol  Anxiety, situational - management per [facility]   Social Alcohol use   Best practice:   Per [facility]  Labs   CBC: Recent Labs  Lab 02/10/19 0051 02/11/19 0606 02/12/19 0556 02/16/2019 0503 02/14/19 0518  WBC 4.9 5.7 8.0 10.9* 9.9  NEUTROABS 3.3 4.7 6.6 9.6* 8.9*  HGB 14.4 14.0 13.9 14.2 13.5  HCT 40.8 40.1 40.1 42.0 40.1  MCV 85.2 85.5 85.7 88.4 89.1  PLT 180 224 274 323 364    Basic Metabolic Panel: Recent Labs  Lab 02/10/19 0051 02/11/19 0606 02/12/19 0556 02/17/2019 0503 02/14/19 0518  NA 132* 137 139 137 137  K 3.7 4.2  4.1 4.3 4.8  CL 102 103 103 101 100  CO2 22 24 25 25 25   GLUCOSE 157* 184* 160* 188* 167*  BUN 11 14 18 17 16   CREATININE 1.02 0.74 0.82 1.03 0.85  CALCIUM 8.1* 8.4* 8.5* 8.4* 8.6*  MG  --  2.3 2.4 2.3  --    GFR: Estimated Creatinine Clearance: 140.6 mL/min (by C-G formula based on SCr of 0.85 mg/dL). Recent Labs  Lab 02/10/19 0424 02/10/19 0822 02/11/19 0606 02/12/19 0556 03/03/2019 0503 02/24/2019 1910 02/14/19 0518  PROCALCITON 0.10  --   --   --   --  0.21 0.18  WBC  --   --  5.7 8.0 10.9*  --  9.9  LATICACIDVEN 1.3 1.2  --   --   --   --   --      Liver Function Tests: Recent Labs  Lab 02/10/19 0051 02/11/19 0606 02/12/19 0556 02/09/2019 0503 02/14/19 0518  AST 61* 53* 49* 43* 33  ALT 105* 97* 91* 93* 67*  ALKPHOS 71 63 60 67 70  BILITOT 0.5 0.7 0.8 1.0 0.9  PROT 7.2 7.2 7.1 7.3 6.9  ALBUMIN 3.6 3.3* 3.2* 3.2* 2.8*   No results for input(s): LIPASE, AMYLASE in the last 168 hours. No results for input(s): AMMONIA in the last 168 hours.  ABG No results found for: PHART, PCO2ART, PO2ART, HCO3, TCO2, ACIDBASEDEF, O2SAT   Coagulation Profile: No results for input(s): INR, PROTIME in the last 168 hours.  Cardiac Enzymes: No results for input(s): CKTOTAL, CKMB, CKMBINDEX, TROPONINI in the last 168 hours.  HbA1C: Hgb A1c MFr Bld  Date/Time Value Ref Range Status  02/17/2019 07:10 PM 6.7 (H) 4.8 - 5.6 % Final    Comment:    (NOTE) Pre diabetes:          5.7%-6.4% Diabetes:              >6.4% Glycemic control for   <7.0% adults with diabetes     CBG: Recent Labs  Lab 02/14/19 0733  GLUCAP 158*    This patient is critically ill with multiple organ system failure; which, requires frequent high complexity decision making, assessment, support, evaluation, and titration of therapies. This was completed through the application of advanced monitoring technologies and extensive interpretation of multiple databases. During this encounter critical care time was devoted to patient care services described in this note for 32 minutes.  Garner Nash, DO [facility] 02/14/2019 10:37 AM

## 2019-02-14 NOTE — Plan of Care (Signed)
  Problem: Respiratory: Goal: Complications related to the disease process, condition or treatment will be avoided or minimized Outcome: Progressing   Problem: Respiratory: Goal: Will maintain a patent airway Outcome: Progressing   

## 2019-02-14 NOTE — Progress Notes (Signed)
PROGRESS NOTE  Walter Oliver  SVX:[id number] DOB: 27-Oct-1970 DOA: 23-Feb-2019 PCP: Patient, No Pcp Per  Brief Narrative: Walter Oliver is a 48 y.o. male with no known PM who presented to [facility] ED 12/8 w/CP and SOB with cough having tested positive for covid-10 12/5. In the ED, CXR demonstrated multifocal pneumonia, SARS-CoV-2 confirmed positive, and he was hypoxic to 74%, improved with 3L O2. WBC 4.9, troponin, PCT, lactic acid all normal. D-dimer 700, LDH 323, ferritin 465. He was admitted and started on remdesivir and steroids, the next day requiring 15L HFNC. On 12/11 the patient's hypoxia and work of breathing continued to worsen and he was transferred to ICU at [facility]. On arrival he required 55LPM HHF and 100% FiO2, continues on this.  Assessment & Plan: Principal Problem:   Acute hypoxemic respiratory failure (HCC) Active Problems:   Pneumonia due to COVID-19 virus   Obesity, Class III, BMI 40-49.9 (morbid obesity) (HCC)   LFT elevation   Acute respiratory failure with hypoxia (HCC)  Acute hypoxic respiratory failure due to covid-19 pneumonia: Clinically worsened and progressed infiltrates on repeat CXR on arrival to [facility] 12/11.  - Will extend remdesivir duration x10 days (12/8 - 12/17), d/w pharmacy. Over past 24 hours, has stabilized, consider tocilizumab if deteriorating at all.  - Continue steroids, dose was augmented at arrival. - s/p CCP Feb 23, 2019 - Trend labs - Continue airborne, contact precautions. - Intermediate dose lovenox - Encourage awake proning.  - Very high risk for intubation, continue HHF. Appreciate pulmonology evaluation.  - Goals of care were discussed, full code, would desire intubation if needed.  LFT elevation: Mild, likely due to covid.  - Trend, improved today.  Obesity: BMI 41. Worsens prognosis.    T2DM: Technically, by HbA1c 6.7%.  - SSI, will get diabetes coordinator involved once more clinically stable.   Anxiety:  - prn ativan  ordered  DVT prophylaxis: Lovenox 0.5mg /kg q12h Code Status: Full Family Communication: None at bedside Disposition Plan: Remain in ICU given risk for intubation  Consultants:   PCCM  Procedures:   None  Antimicrobials:  Remdesivir 12/8 >>   Subjective: Tolerated proning last night, feels better this morning than he did yesterday. No chest pain or other complaints. On blow-by oxygen for breakfast as he's hungry. Has exhibited some anxiety regarding the acuity of his illness.  Objective: Vitals:   02/14/19 0615 02/14/19 0630 02/14/19 0645 02/14/19 0700  BP: (!) 158/74 118/73 (!) 125/59 128/72  Pulse: 80 91 80 97  Resp: (!) 27 (!) 39 (!) 34 (!) 39  Temp:    98.4 F (36.9 C)  TempSrc:    Oral  SpO2: 95% 92% 97% 90%  Weight:      Height:        Intake/Output Summary (Last 24 hours) at 02/14/2019 0845 Last data filed at 02/14/2019 2330 Gross per 24 hour  Intake 954 ml  Output 800 ml  Net 154 ml   Filed Weights   2019-02-23 1730  Weight: 114.8 kg    Gen: 48 y.o. male in no acute distress Pulm: Non-labored but tachypneic with bilateral crackles, no wheezes.  CV: Regular rate and rhythm. No murmur, rub, or gallop. No JVD, no pedal edema. GI: Abdomen soft, non-tender, non-distended, with normoactive bowel sounds. No organomegaly or masses felt. Ext: Warm, no deformities Skin: No rashes, lesions or ulcers Neuro: Alert and oriented. No focal neurological deficits. Psych: Judgement and insight appear normal. Mood & affect appropriate.   Data Reviewed: I have personally  reviewed following labs and imaging studies  CBC: Recent Labs  Lab 02/10/19 0051 02/11/19 0606 02/12/19 0556 Nov 03, 2018 0503 02/14/19 0518  WBC 4.9 5.7 8.0 10.9* 9.9  NEUTROABS 3.3 4.7 6.6 9.6* 8.9*  HGB 14.4 14.0 13.9 14.2 13.5  HCT 40.8 40.1 40.1 42.0 40.1  MCV 85.2 85.5 85.7 88.4 89.1  PLT 180 224 274 323 364   Basic Metabolic Panel: Recent Labs  Lab 02/10/19 0051 02/11/19 0606  02/12/19 0556 Nov 03, 2018 0503 02/14/19 0518  NA 132* 137 139 137 137  K 3.7 4.2 4.1 4.3 4.8  CL 102 103 103 101 100  CO2 22 24 25 25 25   GLUCOSE 157* 184* 160* 188* 167*  BUN 11 14 18 17 16   CREATININE 1.02 0.74 0.82 1.03 0.85  CALCIUM 8.1* 8.4* 8.5* 8.4* 8.6*  MG  --  2.3 2.4 2.3  --    GFR: Estimated Creatinine Clearance: 140.6 mL/min (by C-G formula based on SCr of 0.85 mg/dL). Liver Function Tests: Recent Labs  Lab 02/10/19 0051 02/11/19 0606 02/12/19 0556 Nov 03, 2018 0503 02/14/19 0518  AST 61* 53* 49* 43* 33  ALT 105* 97* 91* 93* 67*  ALKPHOS 71 63 60 67 70  BILITOT 0.5 0.7 0.8 1.0 0.9  PROT 7.2 7.2 7.1 7.3 6.9  ALBUMIN 3.6 3.3* 3.2* 3.2* 2.8*   No results for input(s): LIPASE, AMYLASE in the last 168 hours. No results for input(s): AMMONIA in the last 168 hours. Coagulation Profile: No results for input(s): INR, PROTIME in the last 168 hours. Cardiac Enzymes: No results for input(s): CKTOTAL, CKMB, CKMBINDEX, TROPONINI in the last 168 hours. BNP (last 3 results) No results for input(s): PROBNP in the last 8760 hours. HbA1C: Recent Labs    Nov 03, 2018 1910  HGBA1C 6.7*   CBG: Recent Labs  Lab 02/14/19 0733  GLUCAP 158*   Lipid Profile: No results for input(s): CHOL, HDL, LDLCALC, TRIG, CHOLHDL, LDLDIRECT in the last 72 hours. Thyroid Function Tests: No results for input(s): TSH, T4TOTAL, FREET4, T3FREE, THYROIDAB in the last 72 hours. Anemia Panel: Recent Labs    Nov 03, 2018 0503 02/14/19 0518  FERRITIN 416* 334   Urine analysis: No results found for: COLORURINE, APPEARANCEUR, LABSPEC, PHURINE, GLUCOSEU, HGBUR, BILIRUBINUR, KETONESUR, PROTEINUR, UROBILINOGEN, NITRITE, LEUKOCYTESUR Recent Results (from the past 240 hour(s))  Blood Culture (routine x 2)     Status: None (Preliminary result)   Collection Time: 02/10/19  4:24 AM   Specimen: BLOOD  Result Value Ref Range Status   Specimen Description BLOOD LEFT FA  Final   Special Requests   Final     BOTTLES DRAWN AEROBIC AND ANAEROBIC Blood Culture adequate volume   Culture   Final    NO GROWTH 4 DAYS Performed at [facility], [address] [place], [place] [address]    Report Status PENDING  Incomplete  Blood Culture (routine x 2)     Status: None (Preliminary result)   Collection Time: 02/10/19  4:24 AM   Specimen: BLOOD  Result Value Ref Range Status   Specimen Description BLOOD RIGHT AC  Final   Special Requests   Final    BOTTLES DRAWN AEROBIC AND ANAEROBIC Blood Culture results may not be optimal due to an excessive volume of blood received in culture bottles   Culture   Final    NO GROWTH 4 DAYS Performed at [facility], [address] [place], [place] [address]    Report Status PENDING  Incomplete  MRSA PCR Screening  Status: None   Collection Time: 02-15-19  6:34 PM   Specimen: Nasopharyngeal  Result Value Ref Range Status   MRSA by PCR NEGATIVE NEGATIVE Final    Comment:        The GeneXpert MRSA Assay (FDA approved for NASAL specimens only), is one component of a comprehensive MRSA colonization surveillance program. It is not intended to diagnose MRSA infection nor to guide or monitor treatment for MRSA infections. Performed at [facility], [place] [address] [place], [place] [address]       Radiology Studies: DG CHEST PORT 1 VIEW  Result Date: 2019-02-15 CLINICAL DATA:  Pneumonia EXAM: PORTABLE CHEST 1 VIEW COMPARISON:  February 10, 2019 FINDINGS: There has been significant interval worsening of multifocal airspace opacities, greatest within the right lower lung zone. The lung volumes are low. The heart size remains enlarged. There is no pneumothorax. There may be trace bilateral pleural effusions. IMPRESSION: Worsening multifocal airspace opacities. Electronically Signed   By: Constance Holster M.D.   On: February 15, 2019 18:51    Scheduled Meds: . sodium chloride   Intravenous Once  . Chlorhexidine Gluconate Cloth   6 each Topical Q0600  . enoxaparin (LOVENOX) injection  0.5 mg/kg Subcutaneous Q12H  . insulin aspart  0-5 Units Subcutaneous QHS  . insulin aspart  0-9 Units Subcutaneous TID WC  . mouth rinse  15 mL Mouth Rinse BID  . methylPREDNISolone (SOLU-MEDROL) injection  80 mg Intravenous Q12H  . sodium chloride flush  3 mL Intravenous Q12H  . vitamin C  500 mg Oral Daily  . zinc sulfate  220 mg Oral Daily   Continuous Infusions: . remdesivir 100 mg in NS 100 mL       LOS: 1 day   Time spent: 35 minutes.  Patrecia Pour, MD [facility] [email or web address] 02/14/2019, 8:45 AM

## 2019-02-15 ENCOUNTER — Inpatient Hospital Stay (HOSPITAL_COMMUNITY): Payer: HRSA Program

## 2019-02-15 LAB — COMPREHENSIVE METABOLIC PANEL
ALT: 54 U/L — ABNORMAL HIGH (ref 0–44)
AST: 33 U/L (ref 15–41)
Albumin: 2.7 g/dL — ABNORMAL LOW (ref 3.5–5.0)
Alkaline Phosphatase: 59 U/L (ref 38–126)
Anion gap: 10 (ref 5–15)
BUN: 15 mg/dL (ref 6–20)
CO2: 26 mmol/L (ref 22–32)
Calcium: 8.5 mg/dL — ABNORMAL LOW (ref 8.9–10.3)
Chloride: 101 mmol/L (ref 98–111)
Creatinine, Ser: 0.95 mg/dL (ref 0.61–1.24)
GFR calc Af Amer: >60 mL/min (ref >60)
GFR calc non Af Amer: >60 mL/min (ref >60)
Glucose, Bld: 190 mg/dL — ABNORMAL HIGH (ref 70–99)
Potassium: 4.6 mmol/L (ref 3.5–5.1)
Sodium: 137 mmol/L (ref 135–145)
Total Bilirubin: 1 mg/dL (ref 0.3–1.2)
Total Protein: 6.7 g/dL (ref 6.5–8.1)

## 2019-02-15 LAB — CBC WITH DIFFERENTIAL/PLATELET
Abs Immature Granulocytes: 0.07 10*3/uL (ref 0.00–0.07)
Basophils Absolute: 0 10*3/uL (ref 0.0–0.1)
Basophils Relative: 0 %
Eosinophils Absolute: 0 10*3/uL (ref 0.0–0.5)
Eosinophils Relative: 0 %
HCT: 38.9 % — ABNORMAL LOW (ref 39.0–52.0)
Hemoglobin: 13 g/dL (ref 13.0–17.0)
Immature Granulocytes: 1 %
Lymphocytes Relative: 5 %
Lymphs Abs: 0.4 10*3/uL — ABNORMAL LOW (ref 0.7–4.0)
MCH: 30.1 pg (ref 26.0–34.0)
MCHC: 33.4 g/dL (ref 30.0–36.0)
MCV: 90 fL (ref 80.0–100.0)
Monocytes Absolute: 0.1 10*3/uL (ref 0.1–1.0)
Monocytes Relative: 2 %
Neutro Abs: 7 10*3/uL (ref 1.7–7.7)
Neutrophils Relative %: 92 %
Platelets: 390 10*3/uL (ref 150–400)
RBC: 4.32 MIL/uL (ref 4.22–5.81)
RDW: 13 % (ref 11.5–15.5)
WBC: 7.6 10*3/uL (ref 4.0–10.5)
nRBC: 0 % (ref 0.0–0.2)

## 2019-02-15 LAB — POCT I-STAT 7, (LYTES, BLD GAS, ICA,H+H)
Bicarbonate: 23.9 mmol/L (ref 20.0–28.0)
Calcium, Ion: 1.17 mmol/L (ref 1.15–1.40)
HCT: 38 % — ABNORMAL LOW (ref 39.0–52.0)
Hemoglobin: 12.9 g/dL — ABNORMAL LOW (ref 13.0–17.0)
O2 Saturation: 90 %
Patient temperature: 102
Potassium: 4.1 mmol/L (ref 3.5–5.1)
Sodium: 134 mmol/L — ABNORMAL LOW (ref 135–145)
TCO2: 25 mmol/L (ref 22–32)
pCO2 arterial: 39.7 mmHg (ref 32.0–48.0)
pH, Arterial: 7.395 (ref 7.350–7.450)
pO2, Arterial: 65 mmHg — ABNORMAL LOW (ref 83.0–108.0)

## 2019-02-15 LAB — CULTURE, BLOOD (ROUTINE X 2)
Culture: NO GROWTH
Culture: NO GROWTH
Special Requests: ADEQUATE

## 2019-02-15 LAB — GLUCOSE, CAPILLARY
Glucose-Capillary: 149 mg/dL — ABNORMAL HIGH (ref 70–99)
Glucose-Capillary: 170 mg/dL — ABNORMAL HIGH (ref 70–99)
Glucose-Capillary: 153 mg/dL — ABNORMAL HIGH (ref 70–99)

## 2019-02-15 LAB — C-REACTIVE PROTEIN: CRP: 10.4 mg/dL — ABNORMAL HIGH (ref <1.0)

## 2019-02-15 LAB — D-DIMER, QUANTITATIVE: D-Dimer, Quant: 1.21 ug/mL-FEU — ABNORMAL HIGH (ref 0.00–0.50)

## 2019-02-15 LAB — FERRITIN: Ferritin: 308 ng/mL (ref 24–336)

## 2019-02-15 LAB — PROCALCITONIN: Procalcitonin: 0.17 ng/mL

## 2019-02-15 MED ORDER — METOPROLOL TARTRATE 25 MG PO TABS
25.0000 mg | ORAL_TABLET | Freq: Two times a day (BID) | ORAL | Status: DC
  Administered 2019-02-15 – 2019-02-16 (×2): 25 mg via ORAL
  Filled 2019-02-15 (×2): qty 1

## 2019-02-15 MED ORDER — MIDAZOLAM HCL 2 MG/2ML IJ SOLN
2.0000 mg | Freq: Once | INTRAMUSCULAR | Status: AC
  Administered 2019-02-15: 2 mg via INTRAVENOUS

## 2019-02-15 MED ORDER — IOHEXOL 350 MG/ML SOLN
150.0000 mL | Freq: Once | INTRAVENOUS | Status: AC | PRN
  Administered 2019-02-15: 23:00:00 150 mL via INTRAVENOUS

## 2019-02-15 MED ORDER — FENTANYL CITRATE (PF) 100 MCG/2ML IJ SOLN
50.0000 ug | Freq: Once | INTRAMUSCULAR | Status: AC
  Administered 2019-02-15: 22:00:00 50 ug via INTRAVENOUS

## 2019-02-15 MED ORDER — HYDRALAZINE HCL 25 MG PO TABS: 25.0000 mg | ORAL_TABLET | Freq: Four times a day (QID) | ORAL | Status: DC | PRN

## 2019-02-15 MED ORDER — METOPROLOL TARTRATE 5 MG/5ML IV SOLN
5.0000 mg | INTRAVENOUS | Status: DC | PRN
  Administered 2019-02-15 – 2019-03-03 (×9): 5 mg via INTRAVENOUS
  Filled 2019-02-15 (×9): qty 5

## 2019-02-15 MED ORDER — FENTANYL BOLUS VIA INFUSION
50.0000 ug | INTRAVENOUS | Status: DC | PRN
  Administered 2019-02-15: 50 ug via INTRAVENOUS
  Filled 2019-02-15: qty 50

## 2019-02-15 MED ORDER — ENOXAPARIN SODIUM 60 MG/0.6ML ~~LOC~~ SOLN
0.5000 mg/kg | Freq: Two times a day (BID) | SUBCUTANEOUS | Status: DC
  Administered 2019-02-15 – 2019-02-22 (×14): 55 mg via SUBCUTANEOUS
  Filled 2019-02-15 (×14): qty 0.6

## 2019-02-15 MED ORDER — ROCURONIUM BROMIDE 10 MG/ML (PF) SYRINGE
50.0000 mg | PREFILLED_SYRINGE | Freq: Once | INTRAVENOUS | Status: AC
  Administered 2019-02-15: 19:00:00 50 mg via INTRAVENOUS

## 2019-02-15 MED ORDER — MIDAZOLAM 50MG/50ML (1MG/ML) PREMIX INFUSION
0.0000 mg/h | INTRAVENOUS | Status: DC
  Administered 2019-02-15: 23:00:00 7 mg/h via INTRAVENOUS
  Administered 2019-02-15: 2 mg/h via INTRAVENOUS
  Administered 2019-02-16: 10 mg/h via INTRAVENOUS
  Administered 2019-02-16: 16:00:00 8 mg/h via INTRAVENOUS
  Administered 2019-02-16: 10:00:00 10 mg/h via INTRAVENOUS
  Filled 2019-02-15 (×5): qty 50

## 2019-02-15 MED ORDER — FENTANYL 2500MCG IN NS 250ML (10MCG/ML) PREMIX INFUSION
50.0000 ug/h | INTRAVENOUS | Status: DC
  Administered 2019-02-15: 50 ug/h via INTRAVENOUS
  Administered 2019-02-16: 200 ug/h via INTRAVENOUS
  Filled 2019-02-15 (×2): qty 250

## 2019-02-15 MED ORDER — AMLODIPINE BESYLATE 5 MG PO TABS
10.0000 mg | ORAL_TABLET | Freq: Every day | ORAL | Status: DC
  Administered 2019-02-15 – 2019-02-18 (×4): 10 mg via ORAL
  Filled 2019-02-15 (×5): qty 2

## 2019-02-15 MED ORDER — IOHEXOL 350 MG/ML SOLN: 150.0000 mL | Freq: Once | INTRAVENOUS | Status: DC | PRN

## 2019-02-15 MED ORDER — MIDAZOLAM BOLUS VIA INFUSION
1.0000 mg | INTRAVENOUS | Status: DC | PRN
  Administered 2019-02-15: 2 mg via INTRAVENOUS
  Filled 2019-02-15: qty 2

## 2019-02-15 MED ORDER — FENTANYL CITRATE (PF) 100 MCG/2ML IJ SOLN
100.0000 ug | Freq: Once | INTRAMUSCULAR | Status: AC
  Administered 2019-02-15: 100 ug via INTRAVENOUS

## 2019-02-15 MED ORDER — ETOMIDATE 2 MG/ML IV SOLN
20.0000 mg | Freq: Once | INTRAVENOUS | Status: AC
  Administered 2019-02-15: 19:00:00 20 mg via INTRAVENOUS

## 2019-02-15 NOTE — Progress Notes (Addendum)
PROGRESS NOTE  Walter Oliver  OJJ:[id number] DOB: 08-Oct-1970 DOA: 02-22-19 PCP: Patient, No Pcp Per  Brief Narrative: Walter Oliver is a 48 y.o. male with no known PM who presented to [facility] ED 12/8 w/CP and SOB with cough having tested positive for covid-10 12/5. In the ED, CXR demonstrated multifocal pneumonia, SARS-CoV-2 confirmed positive, and he was hypoxic to 74%, improved with 3L O2. WBC 4.9, troponin, PCT, lactic acid all normal. D-dimer 700, LDH 323, ferritin 465. He was admitted and started on remdesivir and steroids, the next day requiring 15L HFNC. On 12/11 the patient's hypoxia and work of breathing continued to worsen and he was transferred to ICU at [facility]. On arrival he required 55LPM HHF and 100% FiO2, continues on this.  Assessment & Plan: Principal Problem:   Acute hypoxemic respiratory failure (HCC) Active Problems:   Pneumonia due to COVID-19 virus   Obesity, Class III, BMI 40-49.9 (morbid obesity) (HCC)   LFT elevation   Acute respiratory failure with hypoxia (HCC)  Acute hypoxic respiratory failure due to covid-19 pneumonia: Clinically worsened and progressed infiltrates on repeat CXR on arrival to [facility] 12/11. PCT not appreciably elevated, especially in setting of severe respiratory distress from covid, so not starting abx.  - Extended remdesivir duration x10 days (12/8 - 12/17), d/w pharmacy. If intubated, would give tocilizumab. - Continue steroids, dose was augmented at arrival. - s/p CCP 2019/02/22 - Trend labs, CRP still elevated.  - Continue airborne, contact precautions. - Intermediate dose lovenox - Encourage awake proning.  - Very high risk for intubation, continue HHF. Appreciate pulmonology evaluation.   LFT elevation: Mild, likely due to covid.  - Trend, improving today.  Obesity: BMI 41. Worsens prognosis.   HTN: BP elevated consistently thus far.  - Start norvasc and monitor, prn hydralazine also ordered.   T2DM: Technically, by HbA1c 6.7%.   - SSI, will get diabetes coordinator involved once more clinically stable.   Anxiety:  - prn ativan ordered  DVT prophylaxis: Lovenox 0.5mg /kg q12h Code Status: Full Family Communication: None at bedside. Called sons 12/12. Disposition Plan: Remain in ICU now, may transfer to PCU in next 12-24 hrs. Will d/w PCCM.  Consultants:   PCCM  Procedures:   None  Antimicrobials:  Remdesivir 12/8 - 12/17  Subjective: Coming down on supplemental oxygen, still denying dyspnea. No chest pain or other complaints. Wants to go home.  Objective: Vitals:   02/15/19 0700 02/15/19 0737 02/15/19 0800 02/15/19 0847  BP: (!) 143/78 (!) 175/76 (!) 173/83 (!) 173/83  Pulse: (!) 112 (!) 115 (!) 121 (!) 119  Resp:  (!) 24  20  Temp:  98.4 F (36.9 C)    TempSrc:  Oral    SpO2: 100% 100% 100% 100%  Weight:      Height:        Intake/Output Summary (Last 24 hours) at 02/15/2019 1113 Last data filed at 02/15/2019 0800 Gross per 24 hour  Intake 660 ml  Output 400 ml  Net 260 ml   Filed Weights   February 22, 2019 1730  Weight: 114.8 kg   Gen: 48 y.o. male in no distress Pulm: Remains tachypneic but moving better air. Crackles bilaterally. No increased effort. CV: Regular rate and rhythm. No murmur, rub, or gallop. No JVD, no dependent edema. GI: Abdomen soft, non-tender, non-distended, with normoactive bowel sounds.  Ext: Warm, no deformities Skin: No rashes, lesions or ulcers on visualized skin. Neuro: Alert and oriented. No focal neurological deficits. Psych: Judgement and insight appear fair.  Mood anxious & affect congruent. Behavior is appropriate.    Data Reviewed: I have personally reviewed following labs and imaging studies  CBC: Recent Labs  Lab 02/11/19 0606 02/12/19 0556 02/05/2019 0503 02/14/19 0518 02/15/19 0307  WBC 5.7 8.0 10.9* 9.9 7.6  NEUTROABS 4.7 6.6 9.6* 8.9* 7.0  HGB 14.0 13.9 14.2 13.5 13.0  HCT 40.1 40.1 42.0 40.1 38.9*  MCV 85.5 85.7 88.4 89.1 90.0  PLT 224  274 323 364 390   Basic Metabolic Panel: Recent Labs  Lab 02/11/19 0606 02/12/19 0556 02/19/2019 0503 02/14/19 0518 02/15/19 0307  NA 137 139 137 137 137  K 4.2 4.1 4.3 4.8 4.6  CL 103 103 101 100 101  CO2 24 25 25 25 26   GLUCOSE 184* 160* 188* 167* 190*  BUN 14 18 17 16 15   CREATININE 0.74 0.82 1.03 0.85 0.95  CALCIUM 8.4* 8.5* 8.4* 8.6* 8.5*  MG 2.3 2.4 2.3  --   --    GFR: Estimated Creatinine Clearance: 125.8 mL/min (by C-G formula based on SCr of 0.95 mg/dL). Liver Function Tests: Recent Labs  Lab 02/11/19 0606 02/12/19 0556 02/22/2019 0503 02/14/19 0518 02/15/19 0307  AST 53* 49* 43* 33 33  ALT 97* 91* 93* 67* 54*  ALKPHOS 63 60 67 70 59  BILITOT 0.7 0.8 1.0 0.9 1.0  PROT 7.2 7.1 7.3 6.9 6.7  ALBUMIN 3.3* 3.2* 3.2* 2.8* 2.7*   No results for input(s): LIPASE, AMYLASE in the last 168 hours. No results for input(s): AMMONIA in the last 168 hours. Coagulation Profile: No results for input(s): INR, PROTIME in the last 168 hours. Cardiac Enzymes: No results for input(s): CKTOTAL, CKMB, CKMBINDEX, TROPONINI in the last 168 hours. BNP (last 3 results) No results for input(s): PROBNP in the last 8760 hours. HbA1C: Recent Labs    02/12/2019 1910  HGBA1C 6.7*   CBG: Recent Labs  Lab 02/14/19 0733 02/14/19 1141 02/14/19 1648 02/14/19 2229 02/15/19 0757  GLUCAP 158* 181* 144* 174* 149*   Lipid Profile: No results for input(s): CHOL, HDL, LDLCALC, TRIG, CHOLHDL, LDLDIRECT in the last 72 hours. Thyroid Function Tests: No results for input(s): TSH, T4TOTAL, FREET4, T3FREE, THYROIDAB in the last 72 hours. Anemia Panel: Recent Labs    02/14/19 0518 02/15/19 0307  FERRITIN 334 308   Urine analysis: No results found for: COLORURINE, APPEARANCEUR, LABSPEC, PHURINE, GLUCOSEU, HGBUR, BILIRUBINUR, KETONESUR, PROTEINUR, UROBILINOGEN, NITRITE, LEUKOCYTESUR Recent Results (from the past 240 hour(s))  Blood Culture (routine x 2)     Status: None   Collection Time:  02/10/19  4:24 AM   Specimen: BLOOD  Result Value Ref Range Status   Specimen Description BLOOD LEFT FA  Final   Special Requests   Final    BOTTLES DRAWN AEROBIC AND ANAEROBIC Blood Culture adequate volume   Culture   Final    NO GROWTH 5 DAYS Performed at [facility], [address] [place], [place] [address]    Report Status 02/15/2019 FINAL  Final  Blood Culture (routine x 2)     Status: None   Collection Time: 02/10/19  4:24 AM   Specimen: BLOOD  Result Value Ref Range Status   Specimen Description BLOOD RIGHT [facility]  Final   Special Requests   Final    BOTTLES DRAWN AEROBIC AND ANAEROBIC Blood Culture results may not be optimal due to an excessive volume of blood received in culture bottles   Culture   Final    NO GROWTH 5 DAYS Performed  at [facility], [address] [place], [place] [address]    Report Status 02/15/2019 FINAL  Final  MRSA PCR Screening     Status: None   Collection Time: 03/12/19  6:34 PM   Specimen: Nasopharyngeal  Result Value Ref Range Status   MRSA by PCR NEGATIVE NEGATIVE Final    Comment:        The GeneXpert MRSA Assay (FDA approved for NASAL specimens only), is one component of a comprehensive MRSA colonization surveillance program. It is not intended to diagnose MRSA infection nor to guide or monitor treatment for MRSA infections. Performed at [facility], [address] [place], [place] [address]       Radiology Studies: DG CHEST PORT 1 VIEW  Result Date: 03-12-2019 CLINICAL DATA:  Pneumonia EXAM: PORTABLE CHEST 1 VIEW COMPARISON:  February 10, 2019 FINDINGS: There has been significant interval worsening of multifocal airspace opacities, greatest within the right lower lung zone. The lung volumes are low. The heart size remains enlarged. There is no pneumothorax. There may be trace bilateral pleural effusions. IMPRESSION: Worsening multifocal airspace opacities. Electronically Signed   By:  Katherine Mantle M.D.   On: 03-12-2019 18:51    Scheduled Meds: . sodium chloride   Intravenous Once  . Chlorhexidine Gluconate Cloth  6 each Topical Q0600  . enoxaparin (LOVENOX) injection  0.5 mg/kg Subcutaneous Q12H  . insulin aspart  0-5 Units Subcutaneous QHS  . insulin aspart  0-9 Units Subcutaneous TID WC  . mouth rinse  15 mL Mouth Rinse BID  . methylPREDNISolone (SOLU-MEDROL) injection  80 mg Intravenous Q12H  . sodium chloride flush  3 mL Intravenous Q12H  . vitamin C  500 mg Oral Daily  . zinc sulfate  220 mg Oral Daily   Continuous Infusions: . remdesivir 100 mg in NS 100 mL 100 mg (02/15/19 0906)     LOS: 2 days   Time spent: 35 minutes.  Tyrone Nine, MD [facility] [email or web address] 02/15/2019, 11:13 AM

## 2019-02-15 NOTE — Progress Notes (Signed)
NAME:  Walter Oliver, MRN:  [id number], DOB:  04-12-1970, LOS: 2 ADMISSION DATE:  Mar 11, 2019, CONSULTATION DATE:  12/11 REFERRING MD:  Jarvis Newcomer CHIEF COMPLAINT:  dyspnea   Brief History   48 year old male admitted on 12/8 to [facility], then moved to [facility] 2019-03-11 for severe acute respiratory failure with hypoxemia secondary to COVID-19 pneumonia.  History of present illness   This is a 48 year old male who came to [facility] with severe acute respiratory failure with hypoxemia due to COVID-19 pneumonia after he was found to have Covid on 12 5.  He was treated there with steroids and remdesivir and was transferred here for further management.  He was noted to have profoundly worsening hypoxemia on December 11 so he was moved to our facility for further evaluation.  There he has been placed on heated high flow oxygen.  He is breathing quickly but he says that he does not feel short of breath.  He does not have a cough.  He says that he would be willing to go on mechanical ventilation if necessary because he wants to do "anything that is necessary to return to his family  Past Medical History  None listed  Significant Hospital Events   12/8 admission 12/11 move to [facility]  Consults:  PCCM  Procedures:    Significant Diagnostic Tests:    Micro Data:  12/5 SARS COV 2 > positive 12/8 blood >   Antimicrobials/COVID Rx  12/7 decadron x1 12/8 methylpred >  12/7 remdesivir >    Interim history/subjective:   Significant improvement since yesterday.  Now on high flow Salter nasal cannula.  Patient also states that he feels better today..  Objective   Blood pressure (!) 175/76, pulse (!) 115, temperature 99 F (37.2 C), temperature source Oral, resp. rate (!) 24, height 6' (1.829 m), weight 114.8 kg, SpO2 100 %.    FiO2 (%):  [70 %-100 %] 70 %   Intake/Output Summary (Last 24 hours) at 02/15/2019 0756 Last data filed at 02/15/2019 0000 Gross per 24 hour    Intake 903.36 ml  Output 775 ml  Net 128.36 ml   Filed Weights   03/11/2019 1730  Weight: 114.8 kg    Examination:  General: Comfortable sitting up in chair eating breakfast this, gives thumbs up HENT: NCAT, tracking appropriately PULM: Tachypneic, nonlabored bilateral breath sounds with no crackles CV: Tachycardic, regular rhythm, S1-S2 GI: Soft, nontender, nondistended MSK: Normal bulk and tone Neuro: Awake alert moving all 4 extremities no focal deficit  Resolved Hospital Problem list     Assessment & Plan:   ARDS due to COVID-19 pneumonia: Remains in the ICU for close hemodynamic respiratory support At risk for need need of mechanical support however this is decreasing.  Appears to be slowly improving Deescalated from high flow nasal cannula to Salter nasal cannula this morning. Continue to observe in the ICU per [facility]. Pulmonary will be available as needed please call for any critical care needs Continue remdesivir plus Decadron per [facility]  Anxiety, situational - management per [facility]   Social Alcohol use   Best practice:   Per [facility]  Labs   CBC: Recent Labs  Lab 02/11/19 0606 02/12/19 0556 11-Mar-2019 0503 02/14/19 0518 02/15/19 0307  WBC 5.7 8.0 10.9* 9.9 7.6  NEUTROABS 4.7 6.6 9.6* 8.9* 7.0  HGB 14.0 13.9 14.2 13.5 13.0  HCT 40.1 40.1 42.0 40.1 38.9*  MCV 85.5 85.7 88.4 89.1 90.0  PLT 224 274 323 364 390  Basic Metabolic Panel: Recent Labs  Lab 02/11/19 0606 02/12/19 0556 02/27/2019 0503 02/14/19 0518 02/15/19 0307  NA 137 139 137 137 137  K 4.2 4.1 4.3 4.8 4.6  CL 103 103 101 100 101  CO2 24 25 25 25 26   GLUCOSE 184* 160* 188* 167* 190*  BUN 14 18 17 16 15   CREATININE 0.74 0.82 1.03 0.85 0.95  CALCIUM 8.4* 8.5* 8.4* 8.6* 8.5*  MG 2.3 2.4 2.3  --   --    GFR: Estimated Creatinine Clearance: 125.8 mL/min (by C-G formula based on SCr of 0.95 mg/dL). Recent Labs  Lab 02/10/19 0424 02/10/19 0822 02/12/19 0556 02/12/2019 0503 02/14/2019 1910  02/14/19 0518 02/15/19 0307  PROCALCITON 0.10  --   --   --  0.21 0.18 0.17  WBC  --   --  8.0 10.9*  --  9.9 7.6  LATICACIDVEN 1.3 1.2  --   --   --   --   --     Liver Function Tests: Recent Labs  Lab 02/11/19 0606 02/12/19 0556 02/10/2019 0503 02/14/19 0518 02/15/19 0307  AST 53* 49* 43* 33 33  ALT 97* 91* 93* 67* 54*  ALKPHOS 63 60 67 70 59  BILITOT 0.7 0.8 1.0 0.9 1.0  PROT 7.2 7.1 7.3 6.9 6.7  ALBUMIN 3.3* 3.2* 3.2* 2.8* 2.7*   No results for input(s): LIPASE, AMYLASE in the last 168 hours. No results for input(s): AMMONIA in the last 168 hours.  ABG No results found for: PHART, PCO2ART, PO2ART, HCO3, TCO2, ACIDBASEDEF, O2SAT   Coagulation Profile: No results for input(s): INR, PROTIME in the last 168 hours.  Cardiac Enzymes: No results for input(s): CKTOTAL, CKMB, CKMBINDEX, TROPONINI in the last 168 hours.  HbA1C: Hgb A1c MFr Bld  Date/Time Value Ref Range Status  02/05/2019 07:10 PM 6.7 (H) 4.8 - 5.6 % Final    Comment:    (NOTE) Pre diabetes:          5.7%-6.4% Diabetes:              >6.4% Glycemic control for   <7.0% adults with diabetes     CBG: Recent Labs  Lab 02/14/19 0733 02/14/19 1141 02/14/19 1648 02/14/19 2229  GLUCAP 158* 181* 144* 174*    Garner Nash, DO [facility] 02/15/2019 7:56 AM

## 2019-02-15 NOTE — Progress Notes (Signed)
RT called to bedside for a rapid response. Patient on 5L O2 with a sat of 99% but is unresponsive.  Patient transported to ICU. Sats dropped, ICU MD called to bedside to intubate. Patient on vent 100%, PEEP +16, 620 VT. Sats improved to 99%.

## 2019-02-15 NOTE — Procedures (Signed)
Intubation Procedure Note Walter Oliver [id number] [birth year]  Procedure: Intubation Indications: Airway protection and maintenance, acute hypoxemic respiratory failure  Procedure Details Consent: Unable to obtain consent because of emergent medical necessity. Time Out: Verified patient identification, verified procedure, site/side was marked, verified correct patient position, special equipment/implants available, medications/allergies/relevent history reviewed, required imaging and test results available.  Performed  Maximum sterile technique was used including cap, gloves, gown, hand hygiene and mask.  MAC video laryngoscope #4 Patient sedated with etomidate, rocuronium, Versed Grade 1 view vocal cords Endotracheal tube easily passed Good color change on CO2 detector  Evaluation Hemodynamic Status: BP stable throughout; O2 sats: transiently fell during during procedure Patient's Current Condition: Critically ill Complications: No apparent complications Patient did tolerate procedure well. Chest X-ray ordered to verify placement.  CXR: pending.   Walter Oliver 02/15/2019

## 2019-02-15 NOTE — Progress Notes (Signed)
RT NOTE:  Pt transported to CT without event.  

## 2019-02-15 NOTE — Progress Notes (Signed)
eLink Physician-Brief Progress Note Patient Name: Walter Oliver DOB: [birth year] MRN: [id number]   Date of Service  02/15/2019  HPI/Events of Note  Patient intubated tonight due to decreased sensorium. CT head and CTPA ordered. Currently febrile at 102.  eICU Interventions  CXR reviewed and ET and OG tubes in place. Bilateral infiltrates appears unchanged. Ordered blood cultures and follow up on CT scans and decide whether antibiotics need to be initiated.     Intervention Category Major Interventions: Respiratory failure - evaluation and management Intermediate Interventions: Diagnostic test evaluation  Judd Lien 02/15/2019, 8:36 PM

## 2019-02-15 NOTE — Progress Notes (Signed)
PCCM:  Paged by nursing staff and Dr. Bonner Puna to evaluate patient urgently.  Rapid response from the floor.  Patient with altered mental status unable to follow commands moaning.  Once seen in the intensive care unit after transfer from the floor patient had O2 saturation of 32% with good waveform.  Decision made for urgent endotracheal intubation.  Please see separate procedure note.  After discussing with Dr. Bonner Puna patient's altered mental status we will send for stat CT head as well as CTA of the chest due to abrupt hypoxemia to rule out pulmonary embolism.  Garner Nash, DO [facility] 02/15/2019 7:21 PM

## 2019-02-15 NOTE — Progress Notes (Addendum)
1552- Patient condition begin to change. Patient with a MEWS 4. Mews protocol follow. Patient given ativan for restlessness and anxiety. Charge and provider notified. HR of 140 reported at well. MD to order metoprolol for patient. Patient remains stable. No distress noted. 100% on 2L of oxygen.  1656- Oral Metoprolol given for elevated rate. Patient not responding. Patient become less responsive. ICU charge nurse and respiratory therapist came to assess patient.  1830- Patient temperature 102.6. Doctor Bonner Puna notified. Patient transfers back to ICU with ICU charge nurse and respiratory therapist present. Report given to ICU RN Charge.

## 2019-02-16 LAB — POCT I-STAT 7, (LYTES, BLD GAS, ICA,H+H)
Acid-Base Excess: 3 mmol/L — ABNORMAL HIGH (ref 0.0–2.0)
Acid-Base Excess: 3 mmol/L — ABNORMAL HIGH (ref 0.0–2.0)
Bicarbonate: 28.1 mmol/L — ABNORMAL HIGH (ref 20.0–28.0)
Bicarbonate: 28.2 mmol/L — ABNORMAL HIGH (ref 20.0–28.0)
Calcium, Ion: 1.17 mmol/L (ref 1.15–1.40)
Calcium, Ion: 1.17 mmol/L (ref 1.15–1.40)
HCT: 38 % — ABNORMAL LOW (ref 39.0–52.0)
HCT: 39 % (ref 39.0–52.0)
Hemoglobin: 12.9 g/dL — ABNORMAL LOW (ref 13.0–17.0)
Hemoglobin: 13.3 g/dL (ref 13.0–17.0)
O2 Saturation: 73 %
O2 Saturation: 80 %
Patient temperature: 99.4
Patient temperature: 99.4
Potassium: 4.7 mmol/L (ref 3.5–5.1)
Potassium: 4.8 mmol/L (ref 3.5–5.1)
Sodium: 137 mmol/L (ref 135–145)
Sodium: 137 mmol/L (ref 135–145)
TCO2: 29 mmol/L (ref 22–32)
TCO2: 30 mmol/L (ref 22–32)
pCO2 arterial: 45.7 mmHg (ref 32.0–48.0)
pCO2 arterial: 47 mmHg (ref 32.0–48.0)
pH, Arterial: 7.386 (ref 7.350–7.450)
pH, Arterial: 7.4 (ref 7.350–7.450)
pO2, Arterial: 40 mmHg — CL (ref 83.0–108.0)
pO2, Arterial: 47 mmHg — ABNORMAL LOW (ref 83.0–108.0)

## 2019-02-16 LAB — COMPREHENSIVE METABOLIC PANEL
ALT: 46 U/L — ABNORMAL HIGH (ref 0–44)
AST: 32 U/L (ref 15–41)
Albumin: 2.7 g/dL — ABNORMAL LOW (ref 3.5–5.0)
Alkaline Phosphatase: 58 U/L (ref 38–126)
Anion gap: 9 (ref 5–15)
BUN: 24 mg/dL — ABNORMAL HIGH (ref 6–20)
CO2: 27 mmol/L (ref 22–32)
Calcium: 8.3 mg/dL — ABNORMAL LOW (ref 8.9–10.3)
Chloride: 101 mmol/L (ref 98–111)
Creatinine, Ser: 0.98 mg/dL (ref 0.61–1.24)
GFR calc Af Amer: >60 mL/min (ref >60)
GFR calc non Af Amer: >60 mL/min (ref >60)
Glucose, Bld: 194 mg/dL — ABNORMAL HIGH (ref 70–99)
Potassium: 5.3 mmol/L — ABNORMAL HIGH (ref 3.5–5.1)
Sodium: 137 mmol/L (ref 135–145)
Total Bilirubin: 1.2 mg/dL (ref 0.3–1.2)
Total Protein: 6.7 g/dL (ref 6.5–8.1)

## 2019-02-16 LAB — BPAM FFP
Blood Product Expiration Date: 202012130110
ISSUE DATE / TIME: 202012120149
Unit Type and Rh: 5100

## 2019-02-16 LAB — PREPARE FRESH FROZEN PLASMA: Unit division: 0

## 2019-02-16 LAB — GLUCOSE, CAPILLARY
Glucose-Capillary: 159 mg/dL — ABNORMAL HIGH (ref 70–99)
Glucose-Capillary: 164 mg/dL — ABNORMAL HIGH (ref 70–99)
Glucose-Capillary: 172 mg/dL — ABNORMAL HIGH (ref 70–99)
Glucose-Capillary: 189 mg/dL — ABNORMAL HIGH (ref 70–99)
Glucose-Capillary: 194 mg/dL — ABNORMAL HIGH (ref 70–99)
Glucose-Capillary: 203 mg/dL — ABNORMAL HIGH (ref 70–99)

## 2019-02-16 LAB — PROCALCITONIN: Procalcitonin: 0.49 ng/mL

## 2019-02-16 LAB — MAGNESIUM
Magnesium: 2.4 mg/dL (ref 1.7–2.4)
Magnesium: 2.8 mg/dL — ABNORMAL HIGH (ref 1.7–2.4)

## 2019-02-16 LAB — D-DIMER, QUANTITATIVE: D-Dimer, Quant: 4.68 ug/mL-FEU — ABNORMAL HIGH (ref 0.00–0.50)

## 2019-02-16 LAB — PHOSPHORUS
Phosphorus: 4.7 mg/dL — ABNORMAL HIGH (ref 2.5–4.6)
Phosphorus: 5.9 mg/dL — ABNORMAL HIGH (ref 2.5–4.6)

## 2019-02-16 LAB — C-REACTIVE PROTEIN: CRP: 15.7 mg/dL — ABNORMAL HIGH (ref <1.0)

## 2019-02-16 MED ORDER — STERILE WATER FOR INJECTION IJ SOLN
INTRAMUSCULAR | Status: AC
  Administered 2019-02-16: 10 mL
  Filled 2019-02-16: qty 10

## 2019-02-16 MED ORDER — VECURONIUM BROMIDE 10 MG IV SOLR
INTRAVENOUS | Status: AC
  Administered 2019-02-16: 10 mg via INTRAVENOUS
  Filled 2019-02-16: qty 10

## 2019-02-16 MED ORDER — ADULT MULTIVITAMIN W/MINERALS CH
1.0000 | ORAL_TABLET | Freq: Every day | ORAL | Status: DC
  Administered 2019-02-16: 12:00:00 1
  Filled 2019-02-16: qty 1

## 2019-02-16 MED ORDER — VITAL HIGH PROTEIN PO LIQD: 1000.0000 mL | ORAL | Status: DC

## 2019-02-16 MED ORDER — SODIUM POLYSTYRENE SULFONATE 15 GM/60ML PO SUSP
30.0000 g | Freq: Once | ORAL | Status: AC
  Administered 2019-02-16: 30 g
  Filled 2019-02-16: qty 120

## 2019-02-16 MED ORDER — FAMOTIDINE IN NACL 20-0.9 MG/50ML-% IV SOLN
20.0000 mg | Freq: Two times a day (BID) | INTRAVENOUS | Status: DC
  Administered 2019-02-16 – 2019-02-19 (×8): 20 mg via INTRAVENOUS
  Filled 2019-02-16 (×9): qty 50

## 2019-02-16 MED ORDER — CLONAZEPAM 1 MG PO TABS
2.0000 mg | ORAL_TABLET | Freq: Two times a day (BID) | ORAL | Status: DC
  Administered 2019-02-16 – 2019-02-18 (×4): 2 mg
  Filled 2019-02-16 (×4): qty 2

## 2019-02-16 MED ORDER — HYDROMORPHONE BOLUS VIA INFUSION
0.5000 mg | INTRAVENOUS | Status: DC | PRN
  Administered 2019-02-26 – 2019-03-05 (×12): 0.5 mg via INTRAVENOUS
  Filled 2019-02-16: qty 1

## 2019-02-16 MED ORDER — CHLORHEXIDINE GLUCONATE CLOTH 2 % EX PADS
6.0000 | MEDICATED_PAD | Freq: Every day | CUTANEOUS | Status: DC
  Administered 2019-02-16 – 2019-03-07 (×21): 6 via TOPICAL

## 2019-02-16 MED ORDER — ORAL CARE MOUTH RINSE
15.0000 mL | OROMUCOSAL | Status: DC
  Administered 2019-02-16 (×3): 15 mL via OROMUCOSAL

## 2019-02-16 MED ORDER — PRO-STAT SUGAR FREE PO LIQD
60.0000 mL | Freq: Three times a day (TID) | ORAL | Status: DC
  Administered 2019-02-16 – 2019-02-23 (×21): 60 mL
  Filled 2019-02-16 (×24): qty 60

## 2019-02-16 MED ORDER — PROPOFOL 1000 MG/100ML IV EMUL
25.0000 ug/kg/min | INTRAVENOUS | Status: DC
  Administered 2019-02-16: 17:00:00 25 ug/kg/min via INTRAVENOUS
  Administered 2019-02-16 – 2019-02-17 (×2): 40 ug/kg/min via INTRAVENOUS
  Administered 2019-02-17: 45 ug/kg/min via INTRAVENOUS
  Administered 2019-02-17: 40 ug/kg/min via INTRAVENOUS
  Administered 2019-02-17 (×4): 45 ug/kg/min via INTRAVENOUS
  Administered 2019-02-18 (×5): 35 ug/kg/min via INTRAVENOUS
  Administered 2019-02-18: 40 ug/kg/min via INTRAVENOUS
  Administered 2019-02-19 (×2): 35 ug/kg/min via INTRAVENOUS
  Filled 2019-02-16 (×17): qty 100

## 2019-02-16 MED ORDER — INSULIN ASPART 100 UNIT/ML ~~LOC~~ SOLN
0.0000 [IU] | SUBCUTANEOUS | Status: DC
  Administered 2019-02-16: 2 [IU] via SUBCUTANEOUS
  Administered 2019-02-16: 20:00:00 3 [IU] via SUBCUTANEOUS
  Administered 2019-02-16: 2 [IU] via SUBCUTANEOUS
  Administered 2019-02-17 (×3): 3 [IU] via SUBCUTANEOUS
  Administered 2019-02-17: 5 [IU] via SUBCUTANEOUS
  Administered 2019-02-17 – 2019-02-18 (×4): 3 [IU] via SUBCUTANEOUS
  Administered 2019-02-18 (×2): 2 [IU] via SUBCUTANEOUS

## 2019-02-16 MED ORDER — HYDROMORPHONE HCL 1 MG/ML IJ SOLN
1.0000 mg | Freq: Once | INTRAMUSCULAR | Status: AC
  Administered 2019-02-16: 16:00:00 1 mg via INTRAVENOUS

## 2019-02-16 MED ORDER — HYDROMORPHONE BOLUS VIA INFUSION
1.0000 mg | INTRAVENOUS | Status: DC | PRN
  Filled 2019-02-16: qty 1

## 2019-02-16 MED ORDER — OXYCODONE HCL 5 MG PO TABS
5.0000 mg | ORAL_TABLET | Freq: Four times a day (QID) | ORAL | Status: DC
  Administered 2019-02-16 (×2): 5 mg via ORAL
  Filled 2019-02-16 (×2): qty 1

## 2019-02-16 MED ORDER — PRO-STAT SUGAR FREE PO LIQD: 30.0000 mL | Freq: Two times a day (BID) | ORAL | Status: DC

## 2019-02-16 MED ORDER — VITAL AF 1.2 CAL PO LIQD
1000.0000 mL | ORAL | Status: DC
  Administered 2019-02-16 – 2019-03-02 (×7): 1000 mL

## 2019-02-16 MED ORDER — CHLORHEXIDINE GLUCONATE 0.12% ORAL RINSE (MEDLINE KIT): 15.0000 mL | Freq: Two times a day (BID) | OROMUCOSAL | Status: DC

## 2019-02-16 MED ORDER — MIDAZOLAM 50MG/50ML (1MG/ML) PREMIX INFUSION
1.0000 mg/h | INTRAVENOUS | Status: DC
  Administered 2019-02-16 – 2019-02-19 (×11): 8 mg/h via INTRAVENOUS
  Administered 2019-02-19 – 2019-02-21 (×9): 9 mg/h via INTRAVENOUS
  Administered 2019-02-21: 10 mg/h via INTRAVENOUS
  Administered 2019-02-21: 9 mg/h via INTRAVENOUS
  Administered 2019-02-22 (×2): 10 mg/h via INTRAVENOUS
  Administered 2019-02-22 (×2): 8 mg/h via INTRAVENOUS
  Administered 2019-02-22 – 2019-02-24 (×8): 10 mg/h via INTRAVENOUS
  Administered 2019-02-25: 21:00:00 16 mg/h via INTRAVENOUS
  Administered 2019-02-25: 10 mg/h via INTRAVENOUS
  Administered 2019-02-25 (×3): 15 mg/h via INTRAVENOUS
  Administered 2019-02-25: 06:00:00 10 mg/h via INTRAVENOUS
  Administered 2019-02-26 (×2): 20 mg/h via INTRAVENOUS
  Administered 2019-02-26: 06:00:00 16 mg/h via INTRAVENOUS
  Administered 2019-02-26: 18 mg/h via INTRAVENOUS
  Administered 2019-02-26 (×2): 16 mg/h via INTRAVENOUS
  Administered 2019-02-26: 20 mg/h via INTRAVENOUS
  Administered 2019-02-26: 22:00:00 18 mg/h via INTRAVENOUS
  Administered 2019-02-27: 20 mg/h via INTRAVENOUS
  Administered 2019-02-27 (×3): 18 mg/h via INTRAVENOUS
  Administered 2019-02-27 (×2): 20 mg/h via INTRAVENOUS
  Administered 2019-02-27: 18 mg/h via INTRAVENOUS
  Administered 2019-02-28 – 2019-03-01 (×17): 20 mg/h via INTRAVENOUS
  Administered 2019-03-02 (×2): 10 mg/h via INTRAVENOUS
  Administered 2019-03-02: 05:00:00 20 mg/h via INTRAVENOUS
  Administered 2019-03-02 – 2019-03-03 (×3): 10 mg/h via INTRAVENOUS
  Administered 2019-03-03 (×2): 6 mg/h via INTRAVENOUS
  Administered 2019-03-04: 15:00:00 5 mg/h via INTRAVENOUS
  Administered 2019-03-04: 8 mg/h via INTRAVENOUS
  Administered 2019-03-04: 03:00:00 5 mg/h via INTRAVENOUS
  Administered 2019-03-05: 15:00:00 16 mg/h via INTRAVENOUS
  Administered 2019-03-05: 05:00:00 12 mg/h via INTRAVENOUS
  Administered 2019-03-05 (×3): 16 mg/h via INTRAVENOUS
  Administered 2019-03-05: 09:00:00 18 mg/h via INTRAVENOUS
  Administered 2019-03-06: 02:00:00 16 mg/h via INTRAVENOUS
  Administered 2019-03-06: 15 mg/h via INTRAVENOUS
  Administered 2019-03-06: 09:00:00 16 mg/h via INTRAVENOUS
  Administered 2019-03-06: 21:00:00 12 mg/h via INTRAVENOUS
  Administered 2019-03-06 (×3): 16 mg/h via INTRAVENOUS
  Administered 2019-03-07 (×5): 15 mg/h via INTRAVENOUS
  Filled 2019-02-16 (×73): qty 50
  Filled 2019-02-16: qty 100
  Filled 2019-02-16 (×33): qty 50

## 2019-02-16 MED ORDER — ADULT MULTIVITAMIN LIQUID CH
15.0000 mL | Freq: Every day | ORAL | Status: DC
  Administered 2019-02-17 – 2019-03-07 (×19): 15 mL
  Filled 2019-02-16 (×19): qty 15

## 2019-02-16 MED ORDER — CHLORHEXIDINE GLUCONATE 0.12% ORAL RINSE (MEDLINE KIT)
15.0000 mL | Freq: Two times a day (BID) | OROMUCOSAL | Status: DC
  Administered 2019-02-16 – 2019-02-23 (×15): 15 mL via OROMUCOSAL

## 2019-02-16 MED ORDER — CLONAZEPAM 0.1 MG/ML ORAL SUSPENSION
2.0000 mg | Freq: Two times a day (BID) | ORAL | Status: DC
  Filled 2019-02-16: qty 20

## 2019-02-16 MED ORDER — VECURONIUM BROMIDE 10 MG IV SOLR
10.0000 mg | INTRAVENOUS | Status: DC | PRN
  Administered 2019-02-16 – 2019-02-28 (×13): 10 mg via INTRAVENOUS
  Filled 2019-02-16 (×12): qty 10

## 2019-02-16 MED ORDER — OXYCODONE HCL 5 MG PO TABS
5.0000 mg | ORAL_TABLET | Freq: Four times a day (QID) | ORAL | Status: DC
  Administered 2019-02-16 – 2019-02-18 (×7): 5 mg
  Filled 2019-02-16 (×7): qty 1

## 2019-02-16 MED ORDER — TOCILIZUMAB 400 MG/20ML IV SOLN
800.0000 mg | Freq: Once | INTRAVENOUS | Status: AC
  Administered 2019-02-16: 13:00:00 800 mg via INTRAVENOUS
  Filled 2019-02-16: qty 40

## 2019-02-16 MED ORDER — SODIUM CHLORIDE 0.9 % IV SOLN
1.0000 mg/h | INTRAVENOUS | Status: DC
  Administered 2019-02-16: 2 mg/h via INTRAVENOUS
  Administered 2019-02-17 – 2019-02-28 (×21): 4 mg/h via INTRAVENOUS
  Administered 2019-02-28 – 2019-03-02 (×5): 5 mg/h via INTRAVENOUS
  Administered 2019-03-03: 3 mg/h via INTRAVENOUS
  Administered 2019-03-03: 2.5 mg/h via INTRAVENOUS
  Administered 2019-03-04: 3 mg/h via INTRAVENOUS
  Administered 2019-03-04: 2.5 mg/h via INTRAVENOUS
  Administered 2019-03-05 – 2019-03-07 (×6): 5 mg/h via INTRAVENOUS
  Filled 2019-02-16 (×51): qty 5

## 2019-02-16 MED ORDER — ARTIFICIAL TEARS OPHTHALMIC OINT
1.0000 "application " | TOPICAL_OINTMENT | Freq: Three times a day (TID) | OPHTHALMIC | Status: DC
  Administered 2019-02-16 – 2019-03-07 (×56): 1 via OPHTHALMIC
  Filled 2019-02-16 (×16): qty 3.5

## 2019-02-16 MED ORDER — SODIUM CHLORIDE 0.9 % IV SOLN: 1.0000 mg/h | INTRAVENOUS | Status: DC

## 2019-02-16 MED ORDER — CLONAZEPAM 1 MG PO TABS
2.0000 mg | ORAL_TABLET | Freq: Two times a day (BID) | ORAL | Status: DC
  Administered 2019-02-16: 2 mg via ORAL
  Filled 2019-02-16: qty 2

## 2019-02-16 MED ORDER — ORAL CARE MOUTH RINSE
15.0000 mL | OROMUCOSAL | Status: DC
  Administered 2019-02-16 – 2019-03-07 (×183): 15 mL via OROMUCOSAL

## 2019-02-16 NOTE — Progress Notes (Signed)
 Initial Nutrition Assessment  DOCUMENTATION CODES:   Obesity unspecified  INTERVENTION:   Tube Feeding:  Vital AF 1.2 at 40 ml/hr Pro-Stat 60 mL TID (6 packets/day) Provides 162 g of protein, 1752 kcals, 778 mL of free water Meets 100% of protein needs, >90% calorie needs Monitor electrolytes and plan to adjust TF if needed   Add MVI with Minerals daily   NUTRITION DIAGNOSIS:   Inadequate oral intake related to acute illness as evidenced by NPO status.  GOAL:   Patient will meet greater than or equal to 90% of their needs  MONITOR:   Labs, Weight trends, Skin, PO intake, Vent status, TF tolerance  REASON FOR ASSESSMENT:   Consult, Ventilator Enteral/tube feeding initiation and management  ASSESSMENT:   48 yo male admitted with acute respiratory failure due to COVID-19 pneumonia ultimately requiring intubation, AKI. PMH includes DM, anxiety, HTN   RD working remotely.  12/11 Admit 12/13 Intubated  Patient is currently intubated on ventilator support, sedated with fentanyl and versed MV: 12.3 L/min Temp (24hrs), Avg:100 F (37.8 C), Min:98.8 F (37.1 C), Max:102.6 F (39.2 C)  OG tube with tip in stomach  Recorded po intake 40% at breakfast yesterday prior to intubation. Unable to obtain any other diet and weight history at this time  Weight on admit (12/11) 114.8 kg; no weights since. Noted weight of 117.9 kg on 12/08.  Labs: potassium 5.3 (H), phosphorus 4.7 (H), Creatinine wdl, BUN 24, CBGs 164-203 Meds: ss novolog, solumedrol, Vit C, zinc sulfate   Diet Order:   Diet Order            Diet regular Room service appropriate? Yes; Fluid consistency: Thin  Diet effective now              EDUCATION NEEDS:   Not appropriate for education at this time  Skin:  Skin Assessment: Reviewed RN Assessment  Last BM:  12/11  Height:   Ht Readings from Last 1 Encounters:  02/15/19 6' (1.829 m)    Weight:   Wt Readings from Last 1 Encounters:   03/01/19 114.8 kg    Ideal Body Weight:  80.9 kg  BMI:  Body mass index is 34.32 kg/m.  Estimated Nutritional Needs:   Kcal:  0102-7253 kcals  Protein:  162-202 g  Fluid:  >/= 1.8 L     Joseff Luckman MS, RDN, LDN, CNSC [phone] Pager  [phone] Weekend/On-Call Pager

## 2019-02-16 NOTE — Progress Notes (Signed)
[facility] PCCM  Severe acute agitation intermittently Needs prone Plan Add more sedative hypnotic (propofol) in addition to the benzo, narcotics he is on Last resort for vent synchrony prn neuromuscular blockade  Roselie Awkward, MD [facility] Pager: [phone] Cell: [phone] If no response, call [phone]

## 2019-02-16 NOTE — Progress Notes (Signed)
Rapid response called on patient for AMS. Vital signs when rapid response RN assessed patient - BP 141/71, HR 138, Resp 30, Temp 102.6 Orally SP02 99%. Patient obtunded, but responsive to noxious stimuli. Patient appeared to have labored respirations and grunting. Dr. Vance Gather paged. Dr. Leory Plowman Icard paged. Patient transported to ICU and notified staff to pull supplies for emergent intubation. During transport patient became increasingly unresponsive and required to be bagged when entering ICU. Dr. Valeta Harms came to the bedside and intubated the patient without complication. OGT place. CXR ordered. Patient remained hemodynamically stable throughout procedure.

## 2019-02-16 NOTE — Progress Notes (Addendum)
PROGRESS NOTE  Walter Oliver  ZOX:[id number] DOB: 25-Jul-1970 DOA: 03/03/2019 PCP: Patient, No Pcp Per  Brief Narrative: Walter Oliver is a 48 y.o. male with no known PM who presented to [facility] ED 12/8 w/CP and SOB with cough having tested positive for covid-10 12/5. In the ED, CXR demonstrated multifocal pneumonia, SARS-CoV-2 confirmed positive, and he was hypoxic to 74%, improved with 3L O2. WBC 4.9, troponin, PCT, lactic acid all normal. D-dimer 700, LDH 323, ferritin 465. He was admitted and started on remdesivir and steroids, the next day requiring 15L HFNC. On 12/11 the patient's hypoxia and work of breathing continued to worsen and he was transferred to ICU at [facility]. On arrival he required 55LPM HHF and 100% FiO2. With improved respiratory effort, the patient weaned to nasal cannula and transferred to PCU 12/13 only to suffer abrupt respiratory distress and AMS  requiring intubation and transfer back to ICU. CTA chest negative for PE, but showing diffuse opacities throughout lungs. CTA head and neck without acute stroke, LVO, stenosis.   Assessment & Plan: Principal Problem:   Acute hypoxemic respiratory failure (HCC) Active Problems:   Pneumonia due to COVID-19 virus   Obesity, Class III, BMI 40-49.9 (morbid obesity) (HCC)   LFT elevation   Acute respiratory failure with hypoxia (HCC)  Acute hypoxic respiratory failure due to covid-19 pneumonia: Ultimately intubated 12/13.  - Extended remdesivir duration x10 days (12/8 - 12/17), d/w pharmacy. - Concern for aspiration and PE given abrupt nature of decline after transfer to PCU, though no focality to infiltrate on CTA chest and no PE. D/w charge RN who felt SpO2 monitoring was falsely reassuring through yesterday, so decline might have been more progressive. Will hold abx for now, cultures taken. - Will give tocilizumab  IV x1. The off label nature of this medication was discussed with the patient previously and his family who both  opt to proceed in the event of intubation. - Continue steroids, dose was augmented at arrival. - s/p CCP 02/17/2019 - Trend labs, CRP still elevated.  - Continue airborne, contact precautions. - Intermediate dose lovenox - Encourage awake proning.  - Appreciate pulmonology evaluation for vent management and sedation.  LFT elevation: Mild, likely due to covid.  - Trend, improving today.  Hyperkalemia: Mild. Ca corrects to wnl. - Kayexalate per tube, Cr stable. Continue cardiac monitoring.  Obesity: BMI 41. Worsens prognosis.   HTN: BP elevated consistently thus far.  - Started norvasc, metoprolol, HR, BP wnl currently. prn hydralazine also ordered.   T2DM: Technically, by HbA1c 6.7%.  - SSI q4h while on tube feeds, will get diabetes coordinator involved once more clinically stable.   Anxiety:  - prn ativan ordered  DVT prophylaxis: Lovenox 0.5mg /kg q12h Code Status: Full Family Communication: Speaking w/son(s) daily by phone Disposition Plan: Remain in ICU  Consultants:  PCCM  Procedures:  ETT 12/13  Antimicrobials: Remdesivir 12/8 - 12/17  Subjective: Progressive severe hypoxia yesterday evening prompting return to ICU and intubation. Sedated adequately and remains on vent.   Objective: Vitals:   02/16/19 0400 02/16/19 0418 02/16/19 0500 02/16/19 0600  BP:  116/75 105/81 105/74  Pulse:  87 89 88  Resp: (!) 23 (!) 21  (!) 22  Temp: 99.4 F (37.4 C)     TempSrc: Oral     SpO2:   (!) 88% 93%  Weight:      Height:        Intake/Output Summary (Last 24 hours) at 02/16/2019 0840 Last data filed at  02/16/2019 0600 Gross per 24 hour  Intake 599.21 ml  Output 1575 ml  Net -975.79 ml   Filed Weights   02/22/2019 1730  Weight: 114.8 kg   Gen: 48 y.o. male in no distress Pulm: Vented breaths, crackles throughout. CV: Regular rate and rhythm. No murmur, rub, or gallop. No JVD, no dependent edema. GI: Abdomen soft, non-distended, with normoactive bowel sounds.   Ext: Warm, no deformities Skin: No rashes, lesions or ulcers on visualized skin. Neuro: Sedated. Psych: UTD.    Data Reviewed: I have personally reviewed following labs and imaging studies  CBC: Recent Labs  Lab 02/11/19 0606 02/12/19 0556 02/16/2019 0503 02/14/19 0518 02/15/19 0307 02/15/19 2025  WBC 5.7 8.0 10.9* 9.9 7.6  --   NEUTROABS 4.7 6.6 9.6* 8.9* 7.0  --   HGB 14.0 13.9 14.2 13.5 13.0 12.9*  HCT 40.1 40.1 42.0 40.1 38.9* 38.0*  MCV 85.5 85.7 88.4 89.1 90.0  --   PLT 224 274 323 364 390  --    Basic Metabolic Panel: Recent Labs  Lab 02/11/19 0606 02/12/19 0556 02/23/2019 0503 02/14/19 0518 02/15/19 0307 02/15/19 2025 02/16/19 0638  NA 137 139 137 137 137 134* 137  K 4.2 4.1 4.3 4.8 4.6 4.1 5.3*  CL 103 103 101 100 101  --  101  CO2 24 25 25 25 26   --  27  GLUCOSE 184* 160* 188* 167* 190*  --  194*  BUN 14 18 17 16 15   --  24*  CREATININE 0.74 0.82 1.03 0.85 0.95  --  0.98  CALCIUM 8.4* 8.5* 8.4* 8.6* 8.5*  --  8.3*  MG 2.3 2.4 2.3  --   --   --   --    GFR: Estimated Creatinine Clearance: 121.9 mL/min (by C-G formula based on SCr of 0.98 mg/dL). Liver Function Tests: Recent Labs  Lab 02/12/19 0556 02/07/2019 0503 02/14/19 0518 02/15/19 0307 02/16/19 0638  AST 49* 43* 33 33 32  ALT 91* 93* 67* 54* 46*  ALKPHOS 60 67 70 59 58  BILITOT 0.8 1.0 0.9 1.0 1.2  PROT 7.1 7.3 6.9 6.7 6.7  ALBUMIN 3.2* 3.2* 2.8* 2.7* 2.7*   No results for input(s): LIPASE, AMYLASE in the last 168 hours. No results for input(s): AMMONIA in the last 168 hours. Coagulation Profile: No results for input(s): INR, PROTIME in the last 168 hours. Cardiac Enzymes: No results for input(s): CKTOTAL, CKMB, CKMBINDEX, TROPONINI in the last 168 hours. BNP (last 3 results) No results for input(s): PROBNP in the last 8760 hours. HbA1C: Recent Labs    02/14/2019 1910  HGBA1C 6.7*   CBG: Recent Labs  Lab 02/15/19 0757 02/15/19 1650 02/15/19 2230 02/16/19 0036 02/16/19 0427    GLUCAP 149* 170* 153* 164* 203*   Lipid Profile: No results for input(s): CHOL, HDL, LDLCALC, TRIG, CHOLHDL, LDLDIRECT in the last 72 hours. Thyroid Function Tests: No results for input(s): TSH, T4TOTAL, FREET4, T3FREE, THYROIDAB in the last 72 hours. Anemia Panel: Recent Labs    02/14/19 0518 02/15/19 0307  FERRITIN 334 308   Urine analysis: No results found for: COLORURINE, APPEARANCEUR, LABSPEC, PHURINE, GLUCOSEU, HGBUR, BILIRUBINUR, KETONESUR, PROTEINUR, UROBILINOGEN, NITRITE, LEUKOCYTESUR Recent Results (from the past 240 hour(s))  Blood Culture (routine x 2)     Status: None   Collection Time: 02/10/19  4:24 AM   Specimen: BLOOD  Result Value Ref Range Status   Specimen Description BLOOD LEFT FA  Final   Special Requests   Final  BOTTLES DRAWN AEROBIC AND ANAEROBIC Blood Culture adequate volume   Culture   Final    NO GROWTH 5 DAYS Performed at [facility], [address] [place], [place] [address]    Report Status 02/15/2019 FINAL  Final  Blood Culture (routine x 2)     Status: None   Collection Time: 02/10/19  4:24 AM   Specimen: BLOOD  Result Value Ref Range Status   Specimen Description BLOOD RIGHT [facility]  Final   Special Requests   Final    BOTTLES DRAWN AEROBIC AND ANAEROBIC Blood Culture results may not be optimal due to an excessive volume of blood received in culture bottles   Culture   Final    NO GROWTH 5 DAYS Performed at [facility], [address] [place], [place] [address]    Report Status 02/15/2019 FINAL  Final  MRSA PCR Screening     Status: None   Collection Time: 03-14-2019  6:34 PM   Specimen: Nasopharyngeal  Result Value Ref Range Status   MRSA by PCR NEGATIVE NEGATIVE Final    Comment:        The GeneXpert MRSA Assay (FDA approved for NASAL specimens only), is one component of a comprehensive MRSA colonization surveillance program. It is not intended to diagnose MRSA infection nor to guide or monitor treatment  for MRSA infections. Performed at [facility], [address], [place], [place] [address]   Culture, blood (routine x 2)     Status: None (Preliminary result)   Collection Time: 02/15/19 11:00 PM   Specimen: BLOOD  Result Value Ref Range Status   Specimen Description   Final    BLOOD BLOOD LEFT HAND Performed at [facility], [address], [place], [place] [address]    Special Requests   Final    BOTTLES DRAWN AEROBIC AND ANAEROBIC Blood Culture adequate volume Performed at [facility] [address] [place], [place] [address]    Culture   Final    NO GROWTH < 12 HOURS Performed at [facility], [address], [place], [place] [address]    Report Status PENDING  Incomplete  Culture, blood (routine x 2)     Status: None (Preliminary result)   Collection Time: 02/15/19 11:05 PM   Specimen: BLOOD RIGHT HAND  Result Value Ref Range Status   Specimen Description   Final    BLOOD RIGHT HAND Performed at [facility], [address], [place], [place] [address]    Special Requests   Final    BOTTLES DRAWN AEROBIC AND ANAEROBIC Blood Culture adequate volume Performed at [facility], [address] [place], [place] [address]    Culture   Final    NO GROWTH < 12 HOURS Performed at [facility], [address], [place], [place] [address]    Report Status PENDING  Incomplete      Radiology Studies: CT Code Stroke CTA Head W/WO contrast  Result Date: 02/15/2019 CLINICAL DATA:  Initial evaluation for stroke/TIA. EXAM: CT ANGIOGRAPHY HEAD AND NECK TECHNIQUE: Multidetector CT imaging of the head and neck was performed using the standard protocol during bolus administration of intravenous contrast. Multiplanar CT image reconstructions and MIPs were obtained to evaluate the vascular anatomy. Carotid stenosis measurements (when applicable) are obtained utilizing NASCET criteria, using the  distal internal carotid diameter as the denominator. CONTRAST:  OMNIPAQUE IOHEXOL 350 MG/ML SOLN, OMNIPAQUE IOHEXOL 350  MG/ML SOLN, OMNIPAQUE IOHEXOL 350 MG/ML SOLN COMPARISON:  None. FINDINGS: CTA NECK FINDINGS Aortic arch: Aortic arch and origin of the great vessels not well assessed on this examination. Visualized proximal great vessels well opacified and widely patent. Visualized subclavian arteries widely patent. Right carotid system: Right common and internal carotid arteries widely patent without stenosis, dissection, or occlusion. No atheromatous narrowing seen about the right bifurcation. Left carotid system: Left common and internal carotid arteries widely patent without stenosis, dissection, or occlusion. No atheromatous narrowing seen about the left bifurcation. Vertebral arteries: Both vertebral arteries arise from the subclavian arteries. Vertebral arteries widely patent within the neck without stenosis, dissection or occlusion. Skeleton: No acute osseous abnormality. No discrete lytic or blastic osseous lesions. Other neck: Endotracheal and enteric tubes in place. No other acute soft tissue abnormality within the neck. Upper chest: Extensive bilateral airspace disease partially seen within the visualized lung apices. Visualized upper chest demonstrates no other acute finding. Review of the MIP images confirms the above findings CTA HEAD FINDINGS Anterior circulation: Evaluation of the intracranial circulation limited by timing of the contrast bolus, with poor opacification of the major intracranial arterial vasculature. Internal carotid arteries are patent to the termini without obvious stenosis or other abnormality. A1 segments patent. Anterior communicating artery grossly normal. Anterior cerebral arteries grossly patent to their distal aspects without stenosis. No M1 stenosis or occlusion. Right M1 bifurcates early. Distal MCA branches perfused and grossly symmetric. Posterior  circulation: Vertebral arteries widely patent to the vertebrobasilar junction without stenosis. Basilar widely patent to its distal aspect without stenosis. Superior cerebral arteries patent at the origins. Both posterior cerebral arteries appear to be primarily supplied via the basilar well perfused to their distal aspects. Venous sinuses: Visualized major dural sinuses are grossly patent. Anatomic variants: None significant. Review of the MIP images confirms the above findings IMPRESSION: 1. Negative CTA of the head and neck. No large vessel occlusion, hemodynamically significant stenosis, or other acute vascular abnormality. 2. Extensive airspace disease within the visualized lung apices, presumably related to history of COVID pneumonia. Electronically Signed   By: Rise Mu M.D.   On: 02/15/2019 23:49   DG Abd 1 View  Result Date: 02/15/2019 CLINICAL DATA:  Abdominal distension EXAM: ABDOMEN - 1 VIEW COMPARISON:  None. FINDINGS: Gastric catheter is now seen within the stomach. Scattered large and small bowel gas is noted. No bony abnormality is seen. IMPRESSION: Gastric catheter within the stomach. Electronically Signed   By: Alcide Clever M.D.   On: 02/15/2019 20:17   CT ANGIO NECK W OR WO CONTRAST  Result Date: 02/15/2019 CLINICAL DATA:  Initial evaluation for stroke/TIA. EXAM: CT ANGIOGRAPHY HEAD AND NECK TECHNIQUE: Multidetector CT imaging of the head and neck was performed using the standard protocol during bolus administration of intravenous contrast. Multiplanar CT image reconstructions and MIPs were obtained to evaluate the vascular anatomy. Carotid stenosis measurements (when applicable) are obtained utilizing NASCET criteria, using the distal internal carotid diameter as the denominator. CONTRAST:  OMNIPAQUE IOHEXOL 350 MG/ML SOLN, OMNIPAQUE IOHEXOL 350 MG/ML SOLN, OMNIPAQUE IOHEXOL 350 MG/ML SOLN COMPARISON:  None. FINDINGS: CTA NECK FINDINGS Aortic arch: Aortic  arch and origin of the great vessels not well assessed on this examination. Visualized proximal great vessels well opacified and widely patent. Visualized subclavian arteries widely patent. Right carotid system: Right common and internal carotid arteries widely patent without stenosis, dissection, or occlusion. No atheromatous narrowing seen about the right bifurcation. Left carotid system: Left common  and internal carotid arteries widely patent without stenosis, dissection, or occlusion. No atheromatous narrowing seen about the left bifurcation. Vertebral arteries: Both vertebral arteries arise from the subclavian arteries. Vertebral arteries widely patent within the neck without stenosis, dissection or occlusion. Skeleton: No acute osseous abnormality. No discrete lytic or blastic osseous lesions. Other neck: Endotracheal and enteric tubes in place. No other acute soft tissue abnormality within the neck. Upper chest: Extensive bilateral airspace disease partially seen within the visualized lung apices. Visualized upper chest demonstrates no other acute finding. Review of the MIP images confirms the above findings CTA HEAD FINDINGS Anterior circulation: Evaluation of the intracranial circulation limited by timing of the contrast bolus, with poor opacification of the major intracranial arterial vasculature. Internal carotid arteries are patent to the termini without obvious stenosis or other abnormality. A1 segments patent. Anterior communicating artery grossly normal. Anterior cerebral arteries grossly patent to their distal aspects without stenosis. No M1 stenosis or occlusion. Right M1 bifurcates early. Distal MCA branches perfused and grossly symmetric. Posterior circulation: Vertebral arteries widely patent to the vertebrobasilar junction without stenosis. Basilar widely patent to its distal aspect without stenosis. Superior cerebral arteries patent at the origins. Both posterior cerebral arteries appear to be  primarily supplied via the basilar well perfused to their distal aspects. Venous sinuses: Visualized major dural sinuses are grossly patent. Anatomic variants: None significant. Review of the MIP images confirms the above findings IMPRESSION: 1. Negative CTA of the head and neck. No large vessel occlusion, hemodynamically significant stenosis, or other acute vascular abnormality. 2. Extensive airspace disease within the visualized lung apices, presumably related to history of COVID pneumonia. Electronically Signed   By: Jeannine Boga M.D.   On: 02/15/2019 23:49   CT ANGIO CHEST PE W OR WO CONTRAST  Result Date: 02/15/2019 CLINICAL DATA:  Shortness of breath. EXAM: CT ANGIOGRAPHY CHEST WITH CONTRAST TECHNIQUE: Multidetector CT imaging of the chest was performed using the standard protocol during bolus administration of intravenous contrast. Multiplanar CT image reconstructions and MIPs were obtained to evaluate the vascular anatomy. CONTRAST:  174mL OMNIPAQUE IOHEXOL 350 MG/ML SOLN, 115mL OMNIPAQUE IOHEXOL 350 MG/ML SOLN, 176mL OMNIPAQUE IOHEXOL 350 MG/ML SOLN COMPARISON:  None. FINDINGS: Cardiovascular: Evaluation for pulmonary emboli is limited by contrast timing, motion artifact, and streak artifact from the patient's arms. Given these limitations, no definite PE was identified. The heart size is mildly enlarged. There is no significant pericardial effusion. No evidence for a thoracic aortic dissection. Mediastinum/Nodes: --mildly prominent mediastinal hilar lymph nodes are noted --No axillary lymphadenopathy. --No supraclavicular lymphadenopathy. --Normal thyroid gland. --The esophagus is unremarkable Lungs/Pleura: The endotracheal tube terminates above the carina. Diffuse ground-glass airspace opacities are noted throughout all lobes. There is a minimal amount of sparing at the right lung apex and right lung base. There is no pneumothorax. No large pleural effusion. Upper Abdomen: There is possible  underlying hepatic steatosis. The enteric tube terminates in the gastric body. Bilateral renal cysts are noted. Musculoskeletal: Appearance of the sternal body is favored to be secondary to motion artifact. There is no definite displaced fracture. Review of the MIP images confirms the above findings. IMPRESSION: 1. Evaluation for pulmonary emboli is limited by contrast timing, motion artifact, and streak artifact from the patient's arms. Given these limitations, no definite PE was identified. 2. Diffuse bilateral ground-glass airspace opacities involving all lobes. Findings are consistent with the patient's reported history of viral pneumonia. Electronically Signed   By: Constance Holster M.D.   On: 02/15/2019 23:13  DG CHEST PORT 1 VIEW  Result Date: 02/15/2019 CLINICAL DATA:  Hx of difficult intubation EXAM: PORTABLE CHEST 1 VIEW COMPARISON:  Chest radiograph 03/04/2019 FINDINGS: Cardiomediastinal contours likely stable given AP portable technique and rotation. Interval intubation with endotracheal tube tip projecting between the thoracic inlet and carina. There are persistent bilateral left greater than right confluent airspace opacities consistent with infection. No pneumothorax or large pleural effusion. The stomach is hyperinflated. IMPRESSION: 1. Interval intubation with endotracheal tube tip projecting between the thoracic inlet and carina. 2. Persistent bilateral confluent airspace opacities consistent with infection. 3. Gastric hyperinflation. Electronically Signed   By: Emmaline Kluver M.D.   On: 02/15/2019 20:20    Scheduled Meds: . sodium chloride   Intravenous Once  . amLODipine  10 mg Oral Daily  . Chlorhexidine Gluconate Cloth  6 each Topical Q0600  . enoxaparin (LOVENOX) injection  0.5 mg/kg Subcutaneous Q12H  . insulin aspart  0-5 Units Subcutaneous QHS  . insulin aspart  0-9 Units Subcutaneous TID WC  . mouth rinse  15 mL Mouth Rinse BID  . methylPREDNISolone (SOLU-MEDROL)  injection  80 mg Intravenous Q12H  . metoprolol tartrate  25 mg Oral BID  . sodium chloride flush  3 mL Intravenous Q12H  . sodium polystyrene  30 g Per Tube Once  . vitamin C  500 mg Oral Daily  . zinc sulfate  220 mg Oral Daily   Continuous Infusions: . fentaNYL infusion INTRAVENOUS 200 mcg/hr (02/16/19 0600)  . midazolam 10 mg/hr (02/16/19 0600)  . remdesivir 100 mg in NS 100 mL 100 mg (02/15/19 0906)     LOS: 3 days   Time spent: 35 minutes.  Tyrone Nine, MD [facility] [email or web address] 02/16/2019, 8:40 AM

## 2019-02-16 NOTE — Progress Notes (Signed)
NAME:  Walter Oliver, MRN:  [id number], DOB:  08-25-1970, LOS: 3 ADMISSION DATE:  02/07/2019, CONSULTATION DATE:  12/11 REFERRING MD:  Bonner Puna, CHIEF COMPLAINT:  dyspnea   Brief History   48 y/o male admitted 12/8 to [facility], then moved to [facility] on 12/11 for severe acute respiratory faliure with hypoxemia due to COVID 19 pneumonia.   Past Medical History  None listed  Significant Hospital Events   12/8 admission 12/11 move to [facility], PCCM consult 12/13 emergent intubation  Consults:  PCCM  Procedures:  12/13 ETT >   Significant Diagnostic Tests:  12/13 CT head/neck/chest> no occluding clot, no PE, severe bilateral airspace disease  Micro Data:  12/5 SARS COV 2 > positive 12/8 blood >   Antimicrobials/COVID Rx  12/7 decadron x1 12/8 methylpred >  12/7 remdesivir >    Interim history/subjective:  12/13 intubated overnight Some bloody secretions this morning Plateau 33 cm H20  Objective   Blood pressure 112/75, pulse 94, temperature 99.4 F (37.4 C), temperature source Axillary, resp. rate 20, height 6' (1.829 m), weight 114.8 kg, SpO2 91 %.    Vent Mode: PRVC FiO2 (%):  [80 %-100 %] 90 % Set Rate:  [18 bmp] 18 bmp Vt Set:  [786 mL] 620 mL PEEP:  [16 cmH20] 16 cmH20 Plateau Pressure:  [28 cmH20-35 cmH20] 35 cmH20   Intake/Output Summary (Last 24 hours) at 02/16/2019 1023 Last data filed at 02/16/2019 1000 Gross per 24 hour  Intake 815.48 ml  Output 1175 ml  Net -359.52 ml   Filed Weights   02/17/2019 1730  Weight: 114.8 kg    Examination:  General:  In bed on vent HENT: NCAT ETT in place PULM: CTA B, vent supported breathing CV: RRR, no mgr GI: BS+, soft, nontender MSK: normal bulk and tone Neuro: sedated on vent  12/14 CXR images independently reviewed > low lung volumes, severe ddiffuse interstitial edema  Resolved Hospital Problem list     Assessment & Plan:  ARDS due to COVID 19 pneumonia Continue mechanical ventilation per ARDS  protocol Target TVol 6-8cc/kgIBW Target Plateau Pressure < 30cm H20 Target driving pressure less than 15 cm of water Target PaO2 55-65: titrate PEEP/FiO2 per protocol As long as PaO2 to FiO2 ratio is less than 1:150 position in prone position for 16 hours a day Check CVP daily if CVL in place Target CVP less than 4, diurese as necessary Ventilator associated pneumonia prevention protocol 12/14 decrease TVol to 6cc/kg IBW, stop remdesivir, consider tocilizumab, prone  Need for sedation for mechanical ventilation Acute hypoxemic encephalopathy> improved RASS target -3 Change fentanyl to dilaudid infusion Add clonazepam, oral oxycodone   Best practice:  Diet: start tube feeding Pain/Anxiety/Delirium protocol (if indicated): yes, per protocol, as above VAP protocol (if indicated): yes DVT prophylaxis: lovenox 0.5mg /kg sub cutaneous q12h GI prophylaxis: famotidine Glucose control: SSI Mobility: bed rest Code Status: full Family Communication: will update today Disposition: remain in ICU  Labs   CBC: Recent Labs  Lab 02/11/19 0606 02/12/19 0556 02/10/2019 0503 02/14/19 0518 02/15/19 0307 02/15/19 2025  WBC 5.7 8.0 10.9* 9.9 7.6  --   NEUTROABS 4.7 6.6 9.6* 8.9* 7.0  --   HGB 14.0 13.9 14.2 13.5 13.0 12.9*  HCT 40.1 40.1 42.0 40.1 38.9* 38.0*  MCV 85.5 85.7 88.4 89.1 90.0  --   PLT 224 274 323 364 390  --     Basic Metabolic Panel: Recent Labs  Lab 02/11/19 0606 02/12/19 0556 02/12/2019 0503 02/14/19 0518 02/15/19  7494 02/15/19 2025 02/16/19 0638  NA 137 139 137 137 137 134* 137  K 4.2 4.1 4.3 4.8 4.6 4.1 5.3*  CL 103 103 101 100 101  --  101  CO2 24 25 25 25 26   --  27  GLUCOSE 184* 160* 188* 167* 190*  --  194*  BUN 14 18 17 16 15   --  24*  CREATININE 0.74 0.82 1.03 0.85 0.95  --  0.98  CALCIUM 8.4* 8.5* 8.4* 8.6* 8.5*  --  8.3*  MG 2.3 2.4 2.3  --   --   --   --    GFR: Estimated Creatinine Clearance: 121.9 mL/min (by C-G formula based on SCr of 0.98  mg/dL). Recent Labs  Lab 02/10/19 0424 02/10/19 0822 02/12/19 0556 02/18/19 0503 18-Feb-2019 1910 02/14/19 0518 02/15/19 0307 02/16/19 0638  PROCALCITON 0.10  --   --   --  0.21 0.18 0.17 0.49  WBC  --   --  8.0 10.9*  --  9.9 7.6  --   LATICACIDVEN 1.3 1.2  --   --   --   --   --   --     Liver Function Tests: Recent Labs  Lab 02/12/19 0556 18-Feb-2019 0503 02/14/19 0518 02/15/19 0307 02/16/19 0638  AST 49* 43* 33 33 32  ALT 91* 93* 67* 54* 46*  ALKPHOS 60 67 70 59 58  BILITOT 0.8 1.0 0.9 1.0 1.2  PROT 7.1 7.3 6.9 6.7 6.7  ALBUMIN 3.2* 3.2* 2.8* 2.7* 2.7*   No results for input(s): LIPASE, AMYLASE in the last 168 hours. No results for input(s): AMMONIA in the last 168 hours.  ABG    Component Value Date/Time   PHART 7.395 02/15/2019 2025   PCO2ART 39.7 02/15/2019 2025   PO2ART 65.0 (L) 02/15/2019 2025   HCO3 23.9 02/15/2019 2025   TCO2 25 02/15/2019 2025   O2SAT 90.0 02/15/2019 2025     Coagulation Profile: No results for input(s): INR, PROTIME in the last 168 hours.  Cardiac Enzymes: No results for input(s): CKTOTAL, CKMB, CKMBINDEX, TROPONINI in the last 168 hours.  HbA1C: Hgb A1c MFr Bld  Date/Time Value Ref Range Status  02/18/19 07:10 PM 6.7 (H) 4.8 - 5.6 % Final    Comment:    (NOTE) Pre diabetes:          5.7%-6.4% Diabetes:              >6.4% Glycemic control for   <7.0% adults with diabetes     CBG: Recent Labs  Lab 02/15/19 1650 02/15/19 2230 02/16/19 0036 02/16/19 0427 02/16/19 0902  GLUCAP 170* 153* 164* 203* 189*     Critical care time: 33 minutes      02/18/19, MD [place] [facility] Pager: [phone] Cell: [phone] If no response, call [phone]

## 2019-02-16 NOTE — Progress Notes (Signed)
NAME:  Walter Oliver, MRN:  [id number], DOB:  Jun 16, 1970, LOS: 3 ADMISSION DATE:  03/01/2019, CONSULTATION DATE:  12/11 REFERRING MD:  Bonner Puna CHIEF COMPLAINT:  dyspnea   Brief History   48 year old male admitted on 12/8 to [facility], then moved to [facility] February 13, 2019 for severe acute respiratory failure with hypoxemia secondary to COVID-19 pneumonia.  History of present illness   This is a 48 year old male who came to [facility] with severe acute respiratory failure with hypoxemia due to COVID-19 pneumonia after he was found to have Covid on 12 5.  He was treated there with steroids and remdesivir and was transferred here for further management.  He was noted to have profoundly worsening hypoxemia on December 11 so he was moved to our facility for further evaluation.  There he has been placed on heated high flow oxygen.  He is breathing quickly but he says that he does not feel short of breath.  He does not have a cough.  He says that he would be willing to go on mechanical ventilation if necessary because he wants to do "anything that is necessary to return to his family  Past Medical History  None listed  [place] [facility] Events   12/8 admission 12/11 move to [facility]; completed CCP 12/11 12/13 intubated 12/14: Difficulty with ventilator synchrony, added propofol, continued benzodiazepine and narcotic infusions.  Placed in prone position. Actemra administered  Consults:  PCCM  Procedures:    Significant Diagnostic Tests:    Micro Data:  12/5 SARS COV 2 > positive 12/8 blood >   Antimicrobials/COVID Rx  12/7 decadron x1 12/8 methylpred >  12/7 remdesivir >    Interim history/subjective:  Currently in prone position   Objective   Blood pressure 126/80, pulse 88, temperature 99.4 F (37.4 C), temperature source Oral, resp. rate 20, height 6' (1.829 m), weight 114.8 kg, SpO2 (Abnormal) 79 %.    Vent Mode: PRVC FiO2 (%):  [80 %-100 %] 90 % Set Rate:   [18 bmp] 18 bmp Vt Set:  [620 mL] 620 mL PEEP:  [16 cmH20] 16 cmH20 Plateau Pressure:  [28 cmH20-35 cmH20] 35 cmH20   Intake/Output Summary (Last 24 hours) at 02/16/2019 0924 Last data filed at 02/16/2019 0800 Gross per 24 hour  Intake 659.27 ml  Output 1175 ml  Net -515.73 ml   Filed Weights   02/26/2019 1730  Weight: 114.8 kg    Examination: General obese male sedated on full vent support HENT orally intubated pulm equal chest rise. Scattered rhonchi. Pplat 25. Still on PEEP 16 and 100% Card RRR  abd tol tubefeeds Ext edema dependent pulses palp  Neuro heavily sedated   Resolved Hospital Problem list     Assessment & Plan:  Acute hypoxic respiratory failure with ARDS due to COVID-19 pneumonia Acute metabolic encephalopathy Hyperglycemia Elevated LFTs Hyperkalemia and intermittent fluid and electrolyte imbalance Obesity History of hypertension History of anxiety    Acute hypoxic respiratory failure with ARDS due to COVID-19 pneumonia -intubated 12/13 Portable chest x-ray personally reviewed: Worsening diffuse aeration bilaterally, endotracheal tube appears barely at the thoracic inlet Plan Continuing ARDS protocol at a volume goal 6 mL/kg ideal body weight Target plateau pressure less than 30 cmH2O Target driving pressure less than 15 cmH2O Target PaO2 55-65 adjusting PEEP/FiO2 per protocol He will need a central line today, to assess central venous pressure, will initiate diuresis as BUN/creatinine allow pushing for goal CVP less than 4 Continuing extended remdesivir Continuing IV steroids  Continue to trend inflammatory markers PAD protocol with RASS goal negative for Continue clonazepam and oxycodone as well as current propofol, Versed, and Dilaudid infusion    Best practice:  DVT prophylaxis: Lovenox 0.5 mg/kg every 12 CODE STATUS full Diet: Start tube feeds Sedation: IV sedation PUD prophylaxis: Pepcid  My cct 33 min  Simonne Martinet  ACNP-BC [facility] Pulmonary/Critical Care Pager # [phone] OR # [phone] if no answer

## 2019-02-16 NOTE — Progress Notes (Signed)
Transported pt to CT. RT, Designer, multimedia present. No issues with transport.

## 2019-02-16 NOTE — Progress Notes (Signed)
Pt proned at 1730.  Cloth tape and mapelax applied to protect skin.  No complications.

## 2019-02-17 ENCOUNTER — Inpatient Hospital Stay (HOSPITAL_COMMUNITY): Payer: HRSA Program

## 2019-02-17 ENCOUNTER — Inpatient Hospital Stay: Payer: Self-pay

## 2019-02-17 LAB — CBC WITH DIFFERENTIAL/PLATELET
Abs Immature Granulocytes: 0.07 10*3/uL (ref 0.00–0.07)
Basophils Absolute: 0 10*3/uL (ref 0.0–0.1)
Basophils Relative: 0 %
Eosinophils Absolute: 0 10*3/uL (ref 0.0–0.5)
Eosinophils Relative: 0 %
HCT: 40.3 % (ref 39.0–52.0)
Hemoglobin: 12.7 g/dL — ABNORMAL LOW (ref 13.0–17.0)
Immature Granulocytes: 1 %
Lymphocytes Relative: 3 %
Lymphs Abs: 0.3 10*3/uL — ABNORMAL LOW (ref 0.7–4.0)
MCH: 30 pg (ref 26.0–34.0)
MCHC: 31.5 g/dL (ref 30.0–36.0)
MCV: 95 fL (ref 80.0–100.0)
Monocytes Absolute: 0.2 10*3/uL (ref 0.1–1.0)
Monocytes Relative: 3 %
Neutro Abs: 8 10*3/uL — ABNORMAL HIGH (ref 1.7–7.7)
Neutrophils Relative %: 93 %
Platelets: 359 10*3/uL (ref 150–400)
RBC: 4.24 MIL/uL (ref 4.22–5.81)
RDW: 12.8 % (ref 11.5–15.5)
WBC: 8.6 10*3/uL (ref 4.0–10.5)
nRBC: 0 % (ref 0.0–0.2)

## 2019-02-17 LAB — POCT I-STAT 7, (LYTES, BLD GAS, ICA,H+H)
Acid-Base Excess: 8 mmol/L — ABNORMAL HIGH (ref 0.0–2.0)
Bicarbonate: 37.6 mmol/L — ABNORMAL HIGH (ref 20.0–28.0)
Calcium, Ion: 1.15 mmol/L (ref 1.15–1.40)
HCT: 38 % — ABNORMAL LOW (ref 39.0–52.0)
Hemoglobin: 12.9 g/dL — ABNORMAL LOW (ref 13.0–17.0)
O2 Saturation: 95 %
Potassium: 5.2 mmol/L — ABNORMAL HIGH (ref 3.5–5.1)
Sodium: 138 mmol/L (ref 135–145)
TCO2: 40 mmol/L — ABNORMAL HIGH (ref 22–32)
pCO2 arterial: 79.9 mmHg (ref 32.0–48.0)
pH, Arterial: 7.28 — ABNORMAL LOW (ref 7.350–7.450)
pO2, Arterial: 88 mmHg (ref 83.0–108.0)

## 2019-02-17 LAB — COMPREHENSIVE METABOLIC PANEL
ALT: 45 U/L — ABNORMAL HIGH (ref 0–44)
AST: 25 U/L (ref 15–41)
Albumin: 2.4 g/dL — ABNORMAL LOW (ref 3.5–5.0)
Alkaline Phosphatase: 67 U/L (ref 38–126)
Anion gap: 9 (ref 5–15)
BUN: 34 mg/dL — ABNORMAL HIGH (ref 6–20)
CO2: 29 mmol/L (ref 22–32)
Calcium: 8.1 mg/dL — ABNORMAL LOW (ref 8.9–10.3)
Chloride: 99 mmol/L (ref 98–111)
Creatinine, Ser: 1.09 mg/dL (ref 0.61–1.24)
GFR calc Af Amer: >60 mL/min (ref >60)
GFR calc non Af Amer: >60 mL/min (ref >60)
Glucose, Bld: 250 mg/dL — ABNORMAL HIGH (ref 70–99)
Potassium: 5.9 mmol/L — ABNORMAL HIGH (ref 3.5–5.1)
Sodium: 137 mmol/L (ref 135–145)
Total Bilirubin: 0.4 mg/dL (ref 0.3–1.2)
Total Protein: 6.6 g/dL (ref 6.5–8.1)

## 2019-02-17 LAB — GLUCOSE, CAPILLARY
Glucose-Capillary: 208 mg/dL — ABNORMAL HIGH (ref 70–99)
Glucose-Capillary: 224 mg/dL — ABNORMAL HIGH (ref 70–99)
Glucose-Capillary: 225 mg/dL — ABNORMAL HIGH (ref 70–99)
Glucose-Capillary: 230 mg/dL — ABNORMAL HIGH (ref 70–99)
Glucose-Capillary: 234 mg/dL — ABNORMAL HIGH (ref 70–99)
Glucose-Capillary: 245 mg/dL — ABNORMAL HIGH (ref 70–99)
Glucose-Capillary: 248 mg/dL — ABNORMAL HIGH (ref 70–99)
Glucose-Capillary: 260 mg/dL — ABNORMAL HIGH (ref 70–99)

## 2019-02-17 LAB — MAGNESIUM
Magnesium: 2.9 mg/dL — ABNORMAL HIGH (ref 1.7–2.4)
Magnesium: 2.5 mg/dL — ABNORMAL HIGH (ref 1.7–2.4)

## 2019-02-17 LAB — C-REACTIVE PROTEIN: CRP: 18 mg/dL — ABNORMAL HIGH (ref <1.0)

## 2019-02-17 LAB — TRIGLYCERIDES: Triglycerides: 185 mg/dL — ABNORMAL HIGH (ref <150)

## 2019-02-17 LAB — PROCALCITONIN: Procalcitonin: 0.33 ng/mL

## 2019-02-17 LAB — D-DIMER, QUANTITATIVE: D-Dimer, Quant: 2.64 ug/mL-FEU — ABNORMAL HIGH (ref 0.00–0.50)

## 2019-02-17 LAB — PHOSPHORUS
Phosphorus: 4.6 mg/dL (ref 2.5–4.6)
Phosphorus: 4.3 mg/dL (ref 2.5–4.6)

## 2019-02-17 MED ORDER — STERILE WATER FOR INJECTION IJ SOLN
INTRAMUSCULAR | Status: AC
  Filled 2019-02-17: qty 10

## 2019-02-17 MED ORDER — SODIUM CHLORIDE 0.9% FLUSH: 10.0000 mL | INTRAVENOUS | Status: DC | PRN

## 2019-02-17 MED ORDER — SODIUM POLYSTYRENE SULFONATE 15 GM/60ML PO SUSP
30.0000 g | Freq: Once | ORAL | Status: AC
  Administered 2019-02-17: 30 g
  Filled 2019-02-17: qty 120

## 2019-02-17 MED ORDER — SODIUM CHLORIDE 0.9% FLUSH
10.0000 mL | Freq: Two times a day (BID) | INTRAVENOUS | Status: DC
  Administered 2019-02-17 – 2019-03-03 (×28): 10 mL
  Administered 2019-03-04: 40 mL
  Administered 2019-03-04: 10 mL
  Administered 2019-03-05: 20 mL
  Administered 2019-03-05 – 2019-03-07 (×4): 10 mL

## 2019-02-17 MED ORDER — FUROSEMIDE 10 MG/ML IJ SOLN
40.0000 mg | Freq: Once | INTRAMUSCULAR | Status: AC
  Administered 2019-02-17: 40 mg via INTRAVENOUS
  Filled 2019-02-17: qty 4

## 2019-02-17 NOTE — Progress Notes (Signed)
Pt had audible cuff leak, advanced ett to 26 per CCM NP, CXR obtained and showed ETT too high. CCM NP used Laryngscope Mac 4 to observe airway and the ett was barely above the cuff and coiled. ETT advanced with direct view and in proper position at 24 cm at the lip. CXR obtained and confirmed placement

## 2019-02-17 NOTE — Progress Notes (Signed)
Peripherally Inserted Central Catheter/Midline Placement  The IV Nurse has discussed with the patient and/or persons authorized to consent for the patient, the purpose of this procedure and the potential benefits and risks involved with this procedure.  The benefits include less needle sticks, lab draws from the catheter, and the patient may be discharged home with the catheter. Risks include, but not limited to, infection, bleeding, blood clot (thrombus formation), and puncture of an artery; nerve damage and irregular heartbeat and possibility to perform a PICC exchange if needed/ordered by physician.  Alternatives to this procedure were also discussed.  Bard Power PICC patient education guide, fact sheet on infection prevention and patient information card has been provided to patient /or left at bedside.    PICC/Midline Placement Documentation  PICC Triple Lumen 62/70/35 PICC Right Basilic 43 cm 1 cm (Active)  Indication for Insertion or Continuance of Line Vasoactive infusions 02/17/19 1424  Exposed Catheter (cm) 1 cm 02/17/19 1424  Site Assessment Clean;Dry;Intact 02/17/19 1424  Lumen #1 Status Flushed;Saline locked;Blood return noted 02/17/19 1424  Lumen #2 Status Flushed;Saline locked;Blood return noted 02/17/19 1424  Lumen #3 Status Flushed;Saline locked;Blood return noted 02/17/19 1424  Dressing Type Transparent;Securing device 02/17/19 1424  Dressing Status Clean;Dry;Intact;Antimicrobial disc in place 02/17/19 1424  Dressing Intervention New dressing 02/17/19 1424  Dressing Change Due 02/24/19 02/17/19 [place] 02/17/2019, 2:26 PM

## 2019-02-17 NOTE — Progress Notes (Signed)
Pt proned at this time without complication.

## 2019-02-17 NOTE — Progress Notes (Signed)
PROGRESS NOTE  Walter Oliver  BPZ:[id number] DOB: Jan 27, 1971 DOA: 02/26/2019 PCP: Patient, No Pcp Per  Brief Narrative: Walter Oliver is a 48 y.o. male with no known PM who presented to [facility] ED 12/8 w/CP and SOB with cough having tested positive for covid-10 12/5. In the ED, CXR demonstrated multifocal pneumonia, SARS-CoV-2 confirmed positive, and he was hypoxic to 74%, improved with 3L O2. WBC 4.9, troponin, PCT, lactic acid all normal. D-dimer 700, LDH 323, ferritin 465. He was admitted and started on remdesivir and steroids, the next day requiring 15L HFNC. On 12/11 the patient's hypoxia and work of breathing continued to worsen and he was transferred to ICU at [facility]. On arrival he required 55LPM HHF and 100% FiO2. With improved respiratory effort, the patient weaned to nasal cannula and transferred to PCU 12/13 only to suffer abrupt respiratory distress and AMS  requiring intubation and transfer back to ICU. CTA chest negative for PE, but showing diffuse opacities throughout lungs. CTA head and neck without acute stroke, LVO, stenosis.   Assessment & Plan: Principal Problem:   Acute respiratory failure with hypoxemia (HCC) Active Problems:   Pneumonia due to COVID-19 virus   Obesity, Class III, BMI 40-49.9 (morbid obesity) (HCC)   LFT elevation   Acute respiratory failure with hypoxia (HCC)  Acute hypoxic respiratory failure due to covid-19 pneumonia: Ultimately intubated 12/13. CTA chest no PE, severe diffuse opacities bilaterally.  - Extended remdesivir duration x10 days (12/8 - 12/17), d/w pharmacy. - s/p tocilizumab 866m IV 02/16/2019 - Continue steroids, dose was augmented at arrival. - s/p CCP 02/18/2019 - Trend labs, CRP still elevated, rising.  - Continue airborne, contact precautions. - Intermediate dose lovenox - Appreciate pulmonology evaluation for vent management and sedation.  LFT elevation: Mild, likely due to covid.  - Trend, improving today.  Hyperkalemia:  Worse - Increase kayexalate per tube - Loop diuretic - Continue cardiac monitoring.  Obesity: BMI 41. Worsens prognosis.   HTN: BP elevated consistently thus far.  - Started norvasc, metoprolol, HR, BP wnl currently. prn hydralazine also ordered.   T2DM: Technically, by HbA1c 6.7%.  - SSI q4h while on tube feeds, will get diabetes coordinator involved once more clinically stable.   Anxiety:  - prn ativan ordered  DVT prophylaxis: Lovenox 0.577mkg q12h Code Status: Full Family Communication: Per CCM Disposition Plan: Remain in ICU  Consultants:  PCCM  Procedures:  ETT 12/13 Tocilizumab 12/14 CCP 12/11  Antimicrobials: Remdesivir 12/8 - 12/17  Subjective: Proned, vent asynchrony, now on propofol, versed, dilaudid, continues severe hypoxia.   Objective: Vitals:   02/17/19 1200 02/17/19 1230 02/17/19 1300 02/17/19 1330  BP: 113/76 121/78 120/77 115/71  Pulse: 70 81 83 85  Resp: 16 18 18 18   Temp: 98.9 F (37.2 C)     TempSrc: Axillary     SpO2: 98% 99% 99% 99%  Weight:      Height:        Intake/Output Summary (Last 24 hours) at 02/17/2019 1535 Last data filed at 02/17/2019 1400 Gross per 24 hour  Intake 1798.83 ml  Output 2000 ml  Net -201.17 ml   Filed Weights   02/14/2019 1730  Weight: 114.8 kg   Gen: [age].o. male sedated, supine. Pulm: Vent-supported breaths, crackles laterally. CV: Regular rate and rhythm. No murmur, rub, or gallop. No JVD, no pitting dependent edema. GI: Abdomen soft, non-tender, non-distended, with normoactive bowel sounds.  Ext: Warm, no deformities Skin: No new rashes, lesions or ulcers on visualized skin. Neuro:  Sedated, RASS -4 Psych: UTD  Data Reviewed: I have personally reviewed following labs and imaging studies  CBC: Recent Labs  Lab 02/12/19 0556 02/14/2019 0503 02/14/19 0518 02/15/19 0307 02/15/19 2025 02/16/19 1137 02/16/19 1155 02/17/19 0700 02/17/19 1459  WBC 8.0 10.9* 9.9 7.6  --   --   --  8.6  --     NEUTROABS 6.6 9.6* 8.9* 7.0  --   --   --  8.0*  --   HGB 13.9 14.2 13.5 13.0 12.9* 13.3 12.9* 12.7* 12.9*  HCT 40.1 42.0 40.1 38.9* 38.0* 39.0 38.0* 40.3 38.0*  MCV 85.7 88.4 89.1 90.0  --   --   --  95.0  --   PLT 274 323 364 390  --   --   --  359  --    Basic Metabolic Panel: Recent Labs  Lab 02/12/19 0556 02/23/2019 0503 02/14/19 0518 02/15/19 0307 02/16/19 0638 02/16/19 1137 02/16/19 1155 02/16/19 1700 02/17/19 0700 02/17/19 1459  NA 139 137 137 137 137 137 137  --  137 138  K 4.1 4.3 4.8 4.6 5.3* 4.8 4.7  --  5.9* 5.2*  CL 103 101 100 101 101  --   --   --  99  --   CO2 25 25 25 26 27   --   --   --  29  --   GLUCOSE 160* 188* 167* 190* 194*  --   --   --  250*  --   BUN 18 17 16 15  24*  --   --   --  34*  --   CREATININE 0.82 1.03 0.85 0.95 0.98  --   --   --  1.09  --   CALCIUM 8.5* 8.4* 8.6* 8.5* 8.3*  --   --   --  8.1*  --   MG 2.4 2.3  --   --  2.4  --   --  2.8* 2.9*  --   PHOS  --   --   --   --  4.7*  --   --  5.9* 4.6  --    GFR: Estimated Creatinine Clearance: 109.6 mL/min (by C-G formula based on SCr of 1.09 mg/dL). Liver Function Tests: Recent Labs  Lab 02/20/2019 0503 02/14/19 0518 02/15/19 0307 02/16/19 0638 02/17/19 0700  AST 43* 33 33 32 25  ALT 93* 67* 54* 46* 45*  ALKPHOS 67 70 59 58 67  BILITOT 1.0 0.9 1.0 1.2 0.4  PROT 7.3 6.9 6.7 6.7 6.6  ALBUMIN 3.2* 2.8* 2.7* 2.7* 2.4*   No results for input(s): LIPASE, AMYLASE in the last 168 hours. No results for input(s): AMMONIA in the last 168 hours. Coagulation Profile: No results for input(s): INR, PROTIME in the last 168 hours. Cardiac Enzymes: No results for input(s): CKTOTAL, CKMB, CKMBINDEX, TROPONINI in the last 168 hours. BNP (last 3 results) No results for input(s): PROBNP in the last 8760 hours. HbA1C: No results for input(s): HGBA1C in the last 72 hours. CBG: Recent Labs  Lab 02/16/19 2002 02/17/19 0006 02/17/19 0402 02/17/19 0804 02/17/19 1307  GLUCAP 225* 248* 245* 208*  230*   Lipid Profile: Recent Labs    02/17/19 0700  TRIG 185*   Thyroid Function Tests: No results for input(s): TSH, T4TOTAL, FREET4, T3FREE, THYROIDAB in the last 72 hours. Anemia Panel: Recent Labs    02/15/19 0307  FERRITIN 308   Urine analysis: No results found for: COLORURINE, APPEARANCEUR, [place], [place], [place], HGBUR,  BILIRUBINUR, KETONESUR, PROTEINUR, UROBILINOGEN, NITRITE, LEUKOCYTESUR Recent Results (from the past 240 hour(s))  Blood Culture (routine x 2)     Status: None   Collection Time: 02/10/19  4:24 AM   Specimen: BLOOD  Result Value Ref Range Status   Specimen Description BLOOD LEFT FA  Final   Special Requests   Final    BOTTLES DRAWN AEROBIC AND ANAEROBIC Blood Culture adequate volume   Culture   Final    NO GROWTH 5 DAYS Performed at [facility], [address] [place], [place] [address]    Report Status 02/15/2019 FINAL  Final  Blood Culture (routine x 2)     Status: None   Collection Time: 02/10/19  4:24 AM   Specimen: BLOOD  Result Value Ref Range Status   Specimen Description BLOOD RIGHT AC  Final   Special Requests   Final    BOTTLES DRAWN AEROBIC AND ANAEROBIC Blood Culture results may not be optimal due to an excessive volume of blood received in culture bottles   Culture   Final    NO GROWTH 5 DAYS Performed at [facility], [place] [address] [place], [place] [address]    Report Status 02/15/2019 FINAL  Final  MRSA PCR Screening     Status: None   Collection Time: 02/14/2019  6:34 PM   Specimen: Nasopharyngeal  Result Value Ref Range Status   MRSA by PCR NEGATIVE NEGATIVE Final    Comment:        The GeneXpert MRSA Assay (FDA approved for NASAL specimens only), is one component of a comprehensive MRSA colonization surveillance program. It is not intended to diagnose MRSA infection nor to guide or monitor treatment for MRSA infections. Performed at [facility], [place] [address]  [place], [place] [address]   Culture, blood (routine x 2)     Status: None (Preliminary result)   Collection Time: 02/15/19 11:00 PM   Specimen: BLOOD  Result Value Ref Range Status   Specimen Description   Final    BLOOD BLOOD LEFT HAND Performed at [place] [address] [place], [place] [address]    Special Requests   Final    BOTTLES DRAWN AEROBIC AND ANAEROBIC Blood Culture adequate volume Performed at [place] [address] [place], [place] [address]    Culture   Final    NO GROWTH 1 DAY Performed at [facility], [place] [address], [place], [place] [address]    Report Status PENDING  Incomplete  Culture, blood (routine x 2)     Status: None (Preliminary result)   Collection Time: 02/15/19 11:05 PM   Specimen: BLOOD RIGHT HAND  Result Value Ref Range Status   Specimen Description   Final    BLOOD RIGHT HAND Performed at [facility], [place] [address] [place], [place] [address]    Special Requests   Final    BOTTLES DRAWN AEROBIC AND ANAEROBIC Blood Culture adequate volume Performed at [place] [address] [place], [place] [address]    Culture   Final    NO GROWTH 1 DAY Performed at [facility], [place] [address], [place], [place] [address]    Report Status PENDING  Incomplete      Radiology Studies: CT Code Stroke CTA Head W/WO contrast  Result Date: 02/15/2019 CLINICAL DATA:  Initial evaluation for stroke/TIA. EXAM: CT ANGIOGRAPHY HEAD AND NECK TECHNIQUE: Multidetector CT imaging of the  head and neck was performed using the standard protocol during bolus administration of intravenous contrast. Multiplanar CT image reconstructions and MIPs were obtained to evaluate the vascular anatomy. Carotid stenosis measurements (when applicable) are obtained utilizing NASCET criteria, using the distal internal carotid diameter as the denominator. CONTRAST:  125m OMNIPAQUE IOHEXOL 350 MG/ML SOLN,  1543mOMNIPAQUE IOHEXOL 350 MG/ML SOLN, 15082mMNIPAQUE IOHEXOL 350 MG/ML SOLN COMPARISON:  None. FINDINGS: CTA NECK FINDINGS Aortic arch: Aortic arch and origin of the great vessels not well assessed on this examination. Visualized proximal great vessels well opacified and widely patent. Visualized subclavian arteries widely patent. Right carotid system: Right common and internal carotid arteries widely patent without stenosis, dissection, or occlusion. No atheromatous narrowing seen about the right bifurcation. Left carotid system: Left common and internal carotid arteries widely patent without stenosis, dissection, or occlusion. No atheromatous narrowing seen about the left bifurcation. Vertebral arteries: Both vertebral arteries arise from the subclavian arteries. Vertebral arteries widely patent within the neck without stenosis, dissection or occlusion. Skeleton: No acute osseous abnormality. No discrete lytic or blastic osseous lesions. Other neck: Endotracheal and enteric tubes in place. No other acute soft tissue abnormality within the neck. Upper chest: Extensive bilateral airspace disease partially seen within the visualized lung apices. Visualized upper chest demonstrates no other acute finding. Review of the MIP images confirms the above findings CTA HEAD FINDINGS Anterior circulation: Evaluation of the intracranial circulation limited by timing of the contrast bolus, with poor opacification of the major intracranial arterial vasculature. Internal carotid arteries are patent to the termini without obvious stenosis or other abnormality. A1 segments patent. Anterior communicating artery grossly normal. Anterior cerebral arteries grossly patent to their distal aspects without stenosis. No M1 stenosis or occlusion. Right M1 bifurcates early. Distal MCA branches perfused and grossly symmetric. Posterior circulation: Vertebral arteries widely patent to the vertebrobasilar junction without stenosis. Basilar  widely patent to its distal aspect without stenosis. Superior cerebral arteries patent at the origins. Both posterior cerebral arteries appear to be primarily supplied via the basilar well perfused to their distal aspects. Venous sinuses: Visualized major dural sinuses are grossly patent. Anatomic variants: None significant. Review of the MIP images confirms the above findings IMPRESSION: 1. Negative CTA of the head and neck. No large vessel occlusion, hemodynamically significant stenosis, or other acute vascular abnormality. 2. Extensive airspace disease within the visualized lung apices, presumably related to history of COVID pneumonia. Electronically Signed   By: BenJeannine BogaD.   On: 02/15/2019 23:49   DG Abd 1 View  Result Date: 02/15/2019 CLINICAL DATA:  Abdominal distension EXAM: ABDOMEN - 1 VIEW COMPARISON:  None. FINDINGS: Gastric catheter is now seen within the stomach. Scattered large and small bowel gas is noted. No bony abnormality is seen. IMPRESSION: Gastric catheter within the stomach. Electronically Signed   By: MarInez CatalinaD.   On: 02/15/2019 20:17   CT ANGIO NECK W OR WO CONTRAST  Result Date: 02/15/2019 CLINICAL DATA:  Initial evaluation for stroke/TIA. EXAM: CT ANGIOGRAPHY HEAD AND NECK TECHNIQUE: Multidetector CT imaging of the head and neck was performed using the standard protocol during bolus administration of intravenous contrast. Multiplanar CT image reconstructions and MIPs were obtained to evaluate the vascular anatomy. Carotid stenosis measurements (when applicable) are obtained utilizing NASCET criteria, using the distal internal carotid diameter as the denominator. CONTRAST:  150m53mNIPAQUE IOHEXOL 350 MG/ML SOLN, 150mL36mIPAQUE IOHEXOL 350 MG/ML SOLN, 150mL 93mPAQUE IOHEXOL 350 MG/ML SOLN COMPARISON:  None. FINDINGS: CTA NECK FINDINGS  Aortic arch: Aortic arch and origin of the great vessels not well assessed on this examination. Visualized proximal great  vessels well opacified and widely patent. Visualized subclavian arteries widely patent. Right carotid system: Right common and internal carotid arteries widely patent without stenosis, dissection, or occlusion. No atheromatous narrowing seen about the right bifurcation. Left carotid system: Left common and internal carotid arteries widely patent without stenosis, dissection, or occlusion. No atheromatous narrowing seen about the left bifurcation. Vertebral arteries: Both vertebral arteries arise from the subclavian arteries. Vertebral arteries widely patent within the neck without stenosis, dissection or occlusion. Skeleton: No acute osseous abnormality. No discrete lytic or blastic osseous lesions. Other neck: Endotracheal and enteric tubes in place. No other acute soft tissue abnormality within the neck. Upper chest: Extensive bilateral airspace disease partially seen within the visualized lung apices. Visualized upper chest demonstrates no other acute finding. Review of the MIP images confirms the above findings CTA HEAD FINDINGS Anterior circulation: Evaluation of the intracranial circulation limited by timing of the contrast bolus, with poor opacification of the major intracranial arterial vasculature. Internal carotid arteries are patent to the termini without obvious stenosis or other abnormality. A1 segments patent. Anterior communicating artery grossly normal. Anterior cerebral arteries grossly patent to their distal aspects without stenosis. No M1 stenosis or occlusion. Right M1 bifurcates early. Distal MCA branches perfused and grossly symmetric. Posterior circulation: Vertebral arteries widely patent to the vertebrobasilar junction without stenosis. Basilar widely patent to its distal aspect without stenosis. Superior cerebral arteries patent at the origins. Both posterior cerebral arteries appear to be primarily supplied via the basilar well perfused to their distal aspects. Venous sinuses: Visualized  major dural sinuses are grossly patent. Anatomic variants: None significant. Review of the MIP images confirms the above findings IMPRESSION: 1. Negative CTA of the head and neck. No large vessel occlusion, hemodynamically significant stenosis, or other acute vascular abnormality. 2. Extensive airspace disease within the visualized lung apices, presumably related to history of COVID pneumonia. Electronically Signed   By: Jeannine Boga M.D.   On: 02/15/2019 23:49   CT ANGIO CHEST PE W OR WO CONTRAST  Result Date: 02/15/2019 CLINICAL DATA:  Shortness of breath. EXAM: CT ANGIOGRAPHY CHEST WITH CONTRAST TECHNIQUE: Multidetector CT imaging of the chest was performed using the standard protocol during bolus administration of intravenous contrast. Multiplanar CT image reconstructions and MIPs were obtained to evaluate the vascular anatomy. CONTRAST:  129m OMNIPAQUE IOHEXOL 350 MG/ML SOLN, 1558mOMNIPAQUE IOHEXOL 350 MG/ML SOLN, 15079mMNIPAQUE IOHEXOL 350 MG/ML SOLN COMPARISON:  None. FINDINGS: Cardiovascular: Evaluation for pulmonary emboli is limited by contrast timing, motion artifact, and streak artifact from the patient's arms. Given these limitations, no definite PE was identified. The heart size is mildly enlarged. There is no significant pericardial effusion. No evidence for a thoracic aortic dissection. Mediastinum/Nodes: --mildly prominent mediastinal hilar lymph nodes are noted --No axillary lymphadenopathy. --No supraclavicular lymphadenopathy. --Normal thyroid gland. --The esophagus is unremarkable Lungs/Pleura: The endotracheal tube terminates above the carina. Diffuse ground-glass airspace opacities are noted throughout all lobes. There is a minimal amount of sparing at the right lung apex and right lung base. There is no pneumothorax. No large pleural effusion. Upper Abdomen: There is possible underlying hepatic steatosis. The enteric tube terminates in the gastric body. Bilateral renal cysts  are noted. Musculoskeletal: Appearance of the sternal body is favored to be secondary to motion artifact. There is no definite displaced fracture. Review of the MIP images confirms the above findings. IMPRESSION: 1. Evaluation  for pulmonary emboli is limited by contrast timing, motion artifact, and streak artifact from the patient's arms. Given these limitations, no definite PE was identified. 2. Diffuse bilateral ground-glass airspace opacities involving all lobes. Findings are consistent with the patient's reported history of viral pneumonia. Electronically Signed   By: Constance Holster M.D.   On: 02/15/2019 23:13   DG CHEST PORT 1 VIEW  Result Date: 02/17/2019 CLINICAL DATA:  Intubation, pneumonia due to COVID-19 EXAM: PORTABLE CHEST 1 VIEW COMPARISON:  Portable exam 1146 hours compared to 1137 hours FINDINGS: Tip of endotracheal tube projects 3.2 cm above carina. Nasogastric tube projects over stomach. Stable heart size and mediastinal contours. Diffuse BILATERAL pulmonary infiltrates consistent with multifocal pneumonia and history of COVID-19. No pleural effusion or pneumothorax. IMPRESSION: Endotracheal tube tip now projects 3.2 cm above carina. Persistent BILATERAL pulmonary infiltrates consistent with COVID-19 pneumonia. Electronically Signed   By: Lavonia Dana M.D.   On: 02/17/2019 12:10   DG CHEST PORT 1 VIEW  Result Date: 02/17/2019 CLINICAL DATA:  Intubation. EXAM: PORTABLE CHEST 1 VIEW COMPARISON:  02/17/2019. FINDINGS: Endotracheal tube noted with tip at the thoracic inlet in unchanged position. Consider advancement of approximately 4 cm. NG tube in stable position. Heart size stable. Diffuse bilateral pulmonary infiltrates again noted. These have improved from prior exam. No pleural effusion or pneumothorax. IMPRESSION: 1. Endotracheal tube noted with tip at thoracic inlet in unchanged position. Consider advancement of approximately 4 cm. NG tube in stable position. 2. Diffuse bilateral  pulmonary infiltrates again noted. These have improved from prior exam. Electronically Signed   By: [place]   On: 02/17/2019 11:55   DG Chest Port 1 View  Result Date: 02/17/2019 CLINICAL DATA:  Acute respiratory failure with hypoxemia EXAM: PORTABLE CHEST 1 VIEW COMPARISON:  02/15/2019 FINDINGS: Endotracheal tube is at the thoracic inlet. Enteric tube passes into the stomach with tip out of field of view. Increased opacification of the lungs. Cardiomediastinal silhouette is obscured. No pneumothorax. IMPRESSION: Endotracheal tube at or above thoracic inlet, recommend advancing. Significantly increased opacification of the lungs. Electronically Signed   By: Macy Mis M.D.   On: 02/17/2019 08:11   DG CHEST PORT 1 VIEW  Result Date: 02/15/2019 CLINICAL DATA:  Hx of difficult intubation EXAM: PORTABLE CHEST 1 VIEW COMPARISON:  Chest radiograph 02/21/2019 FINDINGS: Cardiomediastinal contours likely stable given AP portable technique and rotation. Interval intubation with endotracheal tube tip projecting between the thoracic inlet and carina. There are persistent bilateral left greater than right confluent airspace opacities consistent with infection. No pneumothorax or large pleural effusion. The stomach is hyperinflated. IMPRESSION: 1. Interval intubation with endotracheal tube tip projecting between the thoracic inlet and carina. 2. Persistent bilateral confluent airspace opacities consistent with infection. 3. Gastric hyperinflation. Electronically Signed   By: Audie Pinto M.D.   On: 02/15/2019 20:20   [place] EKG SITE RITE  Result Date: 02/17/2019 If Site Rite image not attached, placement could not be confirmed due to current cardiac rhythm.   Scheduled Meds:  amLODipine  10 mg Oral Daily   artificial tears  1 application Both Eyes D7O   chlorhexidine gluconate (MEDLINE KIT)  15 mL Mouth Rinse BID   Chlorhexidine Gluconate Cloth  6 each Topical Daily   clonazePAM  2 mg Per  Tube BID   enoxaparin (LOVENOX) injection  0.5 mg/kg Subcutaneous Q12H   feeding supplement (PRO-STAT SUGAR FREE 64)  60 mL Per Tube TID   insulin aspart  0-9 Units Subcutaneous Q4H  mouth rinse  15 mL Mouth Rinse 10 times per day   methylPREDNISolone (SOLU-MEDROL) injection  80 mg Intravenous Q12H   multivitamin  15 mL Per Tube Daily   oxyCODONE  5 mg Per Tube Q6H   sodium chloride flush  10-40 mL Intracatheter Q12H   sodium chloride flush  3 mL Intravenous Q12H   vitamin C  500 mg Oral Daily   zinc sulfate  220 mg Oral Daily   Continuous Infusions:  famotidine (PEPCID) IV Stopped (02/17/19 1006)   feeding supplement (VITAL AF 1.2 CAL) 40 mL/hr at 02/17/19 0900   HYDROmorphone 4 mg/hr (02/17/19 1400)   midazolam 8 mg/hr (02/17/19 1400)   propofol (DIPRIVAN) infusion 45 mcg/kg/min (02/17/19 1454)   remdesivir 100 mg in NS 100 mL Stopped (02/17/19 1153)     LOS: 4 days   Time spent: 35 minutes.  Patrecia Pour, MD [facility] [email or web address] 02/17/2019, 3:35 PM

## 2019-02-17 NOTE — Progress Notes (Signed)
Pt supined without any complications.  ETT secured with commercial tube holder.  Skin integrity is good.

## 2019-02-18 LAB — POCT I-STAT 7, (LYTES, BLD GAS, ICA,H+H)
Acid-Base Excess: 13 mmol/L — ABNORMAL HIGH (ref 0.0–2.0)
Acid-Base Excess: 14 mmol/L — ABNORMAL HIGH (ref 0.0–2.0)
Bicarbonate: 41.7 mmol/L — ABNORMAL HIGH (ref 20.0–28.0)
Bicarbonate: 43 mmol/L — ABNORMAL HIGH (ref 20.0–28.0)
Calcium, Ion: 1.16 mmol/L (ref 1.15–1.40)
Calcium, Ion: 1.11 mmol/L — ABNORMAL LOW (ref 1.15–1.40)
HCT: 36 % — ABNORMAL LOW (ref 39.0–52.0)
HCT: 38 % — ABNORMAL LOW (ref 39.0–52.0)
Hemoglobin: 12.2 g/dL — ABNORMAL LOW (ref 13.0–17.0)
Hemoglobin: 12.9 g/dL — ABNORMAL LOW (ref 13.0–17.0)
O2 Saturation: 100 %
O2 Saturation: 91 %
Patient temperature: 99
Patient temperature: 98.7
Potassium: 5.2 mmol/L — ABNORMAL HIGH (ref 3.5–5.1)
Potassium: 4.4 mmol/L (ref 3.5–5.1)
Sodium: 141 mmol/L (ref 135–145)
Sodium: 144 mmol/L (ref 135–145)
TCO2: 44 mmol/L — ABNORMAL HIGH (ref 22–32)
TCO2: 45 mmol/L — ABNORMAL HIGH (ref 22–32)
pCO2 arterial: 74.5 mmHg (ref 32.0–48.0)
pCO2 arterial: 74.9 mmHg (ref 32.0–48.0)
pH, Arterial: 7.357 (ref 7.350–7.450)
pH, Arterial: 7.368 (ref 7.350–7.450)
pO2, Arterial: 189 mmHg — ABNORMAL HIGH (ref 83.0–108.0)
pO2, Arterial: 67 mmHg — ABNORMAL LOW (ref 83.0–108.0)

## 2019-02-18 LAB — COMPREHENSIVE METABOLIC PANEL
ALT: 43 U/L (ref 0–44)
AST: 24 U/L (ref 15–41)
Albumin: 2.3 g/dL — ABNORMAL LOW (ref 3.5–5.0)
Alkaline Phosphatase: 64 U/L (ref 38–126)
Anion gap: 9 (ref 5–15)
BUN: 33 mg/dL — ABNORMAL HIGH (ref 6–20)
CO2: 36 mmol/L — ABNORMAL HIGH (ref 22–32)
Calcium: 8.2 mg/dL — ABNORMAL LOW (ref 8.9–10.3)
Chloride: 99 mmol/L (ref 98–111)
Creatinine, Ser: 0.96 mg/dL (ref 0.61–1.24)
GFR calc Af Amer: >60 mL/min (ref >60)
GFR calc non Af Amer: >60 mL/min (ref >60)
Glucose, Bld: 235 mg/dL — ABNORMAL HIGH (ref 70–99)
Potassium: 5.3 mmol/L — ABNORMAL HIGH (ref 3.5–5.1)
Sodium: 144 mmol/L (ref 135–145)
Total Bilirubin: 0.4 mg/dL (ref 0.3–1.2)
Total Protein: 6.2 g/dL — ABNORMAL LOW (ref 6.5–8.1)

## 2019-02-18 LAB — CBC WITH DIFFERENTIAL/PLATELET
Abs Immature Granulocytes: 0.09 10*3/uL — ABNORMAL HIGH (ref 0.00–0.07)
Basophils Absolute: 0 10*3/uL (ref 0.0–0.1)
Basophils Relative: 0 %
Eosinophils Absolute: 0 10*3/uL (ref 0.0–0.5)
Eosinophils Relative: 0 %
HCT: 40.5 % (ref 39.0–52.0)
Hemoglobin: 12.8 g/dL — ABNORMAL LOW (ref 13.0–17.0)
Immature Granulocytes: 1 %
Lymphocytes Relative: 2 %
Lymphs Abs: 0.2 10*3/uL — ABNORMAL LOW (ref 0.7–4.0)
MCH: 30.3 pg (ref 26.0–34.0)
MCHC: 31.6 g/dL (ref 30.0–36.0)
MCV: 95.7 fL (ref 80.0–100.0)
Monocytes Absolute: 0.2 10*3/uL (ref 0.1–1.0)
Monocytes Relative: 2 %
Neutro Abs: 7 10*3/uL (ref 1.7–7.7)
Neutrophils Relative %: 95 %
Platelets: 360 10*3/uL (ref 150–400)
RBC: 4.23 MIL/uL (ref 4.22–5.81)
RDW: 12.7 % (ref 11.5–15.5)
WBC: 7.4 10*3/uL (ref 4.0–10.5)
nRBC: 0 % (ref 0.0–0.2)

## 2019-02-18 LAB — TRIGLYCERIDES: Triglycerides: 231 mg/dL — ABNORMAL HIGH (ref <150)

## 2019-02-18 LAB — C-REACTIVE PROTEIN: CRP: 9.4 mg/dL — ABNORMAL HIGH (ref <1.0)

## 2019-02-18 LAB — GLUCOSE, CAPILLARY
Glucose-Capillary: 194 mg/dL — ABNORMAL HIGH (ref 70–99)
Glucose-Capillary: 194 mg/dL — ABNORMAL HIGH (ref 70–99)
Glucose-Capillary: 199 mg/dL — ABNORMAL HIGH (ref 70–99)
Glucose-Capillary: 253 mg/dL — ABNORMAL HIGH (ref 70–99)
Glucose-Capillary: 221 mg/dL — ABNORMAL HIGH (ref 70–99)

## 2019-02-18 LAB — D-DIMER, QUANTITATIVE: D-Dimer, Quant: 2.46 ug/mL-FEU — ABNORMAL HIGH (ref 0.00–0.50)

## 2019-02-18 LAB — PROCALCITONIN: Procalcitonin: 0.22 ng/mL

## 2019-02-18 MED ORDER — FUROSEMIDE 10 MG/ML IJ SOLN
40.0000 mg | Freq: Once | INTRAMUSCULAR | Status: AC
  Administered 2019-02-18: 40 mg via INTRAVENOUS
  Filled 2019-02-18: qty 4

## 2019-02-18 MED ORDER — INSULIN ASPART 100 UNIT/ML ~~LOC~~ SOLN
0.0000 [IU] | SUBCUTANEOUS | Status: DC
  Administered 2019-02-18: 7 [IU] via SUBCUTANEOUS
  Administered 2019-02-18: 17:00:00 11 [IU] via SUBCUTANEOUS
  Administered 2019-02-19 (×2): 7 [IU] via SUBCUTANEOUS
  Administered 2019-02-19 (×2): 4 [IU] via SUBCUTANEOUS
  Administered 2019-02-19: 16:00:00 5 [IU] via SUBCUTANEOUS
  Administered 2019-02-19: 4 [IU] via SUBCUTANEOUS
  Administered 2019-02-19 – 2019-02-20 (×3): 7 [IU] via SUBCUTANEOUS
  Administered 2019-02-20: 11 [IU] via SUBCUTANEOUS
  Administered 2019-02-20: 4 [IU] via SUBCUTANEOUS
  Administered 2019-02-20 (×2): 7 [IU] via SUBCUTANEOUS
  Administered 2019-02-21 (×4): 4 [IU] via SUBCUTANEOUS
  Administered 2019-02-21: 11 [IU] via SUBCUTANEOUS
  Administered 2019-02-22 (×2): 7 [IU] via SUBCUTANEOUS
  Administered 2019-02-22 (×2): 4 [IU] via SUBCUTANEOUS
  Administered 2019-02-22: 7 [IU] via SUBCUTANEOUS
  Administered 2019-02-22 – 2019-02-23 (×4): 4 [IU] via SUBCUTANEOUS
  Administered 2019-02-23: 3 [IU] via SUBCUTANEOUS
  Administered 2019-02-23: 7 [IU] via SUBCUTANEOUS
  Administered 2019-02-23: 3 [IU] via SUBCUTANEOUS
  Administered 2019-02-23: 4 [IU] via SUBCUTANEOUS
  Administered 2019-02-24: 3 [IU] via SUBCUTANEOUS
  Administered 2019-02-24 (×2): 4 [IU] via SUBCUTANEOUS
  Administered 2019-02-24 – 2019-02-25 (×3): 7 [IU] via SUBCUTANEOUS
  Administered 2019-02-25 (×2): 3 [IU] via SUBCUTANEOUS
  Administered 2019-02-25: 4 [IU] via SUBCUTANEOUS
  Administered 2019-02-25 (×2): 3 [IU] via SUBCUTANEOUS
  Administered 2019-02-26: 4 [IU] via SUBCUTANEOUS
  Administered 2019-02-26 (×3): 3 [IU] via SUBCUTANEOUS
  Administered 2019-02-26: 4 [IU] via SUBCUTANEOUS
  Administered 2019-02-26: 3 [IU] via SUBCUTANEOUS
  Administered 2019-02-26: 4 [IU] via SUBCUTANEOUS
  Administered 2019-02-27: 15:00:00 3 [IU] via SUBCUTANEOUS
  Administered 2019-02-27: 4 [IU] via SUBCUTANEOUS
  Administered 2019-02-27: 3 [IU] via SUBCUTANEOUS
  Administered 2019-02-28: 16:00:00 15 [IU] via SUBCUTANEOUS
  Administered 2019-02-28: 4 [IU] via SUBCUTANEOUS
  Administered 2019-02-28: 7 [IU] via SUBCUTANEOUS
  Administered 2019-02-28: 4 [IU] via SUBCUTANEOUS
  Administered 2019-02-28: 08:00:00 3 [IU] via SUBCUTANEOUS
  Administered 2019-02-28: 20:00:00 11 [IU] via SUBCUTANEOUS
  Administered 2019-03-01: 4 [IU] via SUBCUTANEOUS
  Administered 2019-03-01: 7 [IU] via SUBCUTANEOUS
  Administered 2019-03-01: 4 [IU] via SUBCUTANEOUS
  Administered 2019-03-01: 3 [IU] via SUBCUTANEOUS
  Administered 2019-03-01: 4 [IU] via SUBCUTANEOUS
  Administered 2019-03-02: 7 [IU] via SUBCUTANEOUS
  Administered 2019-03-02 (×4): 4 [IU] via SUBCUTANEOUS
  Administered 2019-03-03: 16:00:00 7 [IU] via SUBCUTANEOUS
  Administered 2019-03-03: 08:00:00 4 [IU] via SUBCUTANEOUS
  Administered 2019-03-03: 04:00:00 7 [IU] via SUBCUTANEOUS
  Administered 2019-03-03: 12:00:00 4 [IU] via SUBCUTANEOUS
  Administered 2019-03-03: 7 [IU] via SUBCUTANEOUS
  Administered 2019-03-03 – 2019-03-04 (×3): 4 [IU] via SUBCUTANEOUS
  Administered 2019-03-04: 7 [IU] via SUBCUTANEOUS
  Administered 2019-03-04: 4 [IU] via SUBCUTANEOUS
  Administered 2019-03-04: 01:00:00 3 [IU] via SUBCUTANEOUS
  Administered 2019-03-04 – 2019-03-05 (×4): 4 [IU] via SUBCUTANEOUS
  Administered 2019-03-05 – 2019-03-06 (×4): 3 [IU] via SUBCUTANEOUS
  Administered 2019-03-06: 05:00:00 4 [IU] via SUBCUTANEOUS
  Administered 2019-03-06: 3 [IU] via SUBCUTANEOUS
  Administered 2019-03-06 (×2): 4 [IU] via SUBCUTANEOUS
  Administered 2019-03-07: 7 [IU] via SUBCUTANEOUS
  Administered 2019-03-07: 4 [IU] via SUBCUTANEOUS
  Administered 2019-03-07: 3 [IU] via SUBCUTANEOUS
  Administered 2019-03-07: 4 [IU] via SUBCUTANEOUS

## 2019-02-18 MED ORDER — MAGNESIUM SULFATE 2 GM/50ML IV SOLN
2.0000 g | Freq: Once | INTRAVENOUS | Status: AC
  Administered 2019-02-18: 12:00:00 2 g via INTRAVENOUS
  Filled 2019-02-18: qty 50

## 2019-02-18 MED ORDER — OXYCODONE HCL 5 MG PO TABS
10.0000 mg | ORAL_TABLET | Freq: Four times a day (QID) | ORAL | Status: DC
  Administered 2019-02-18 – 2019-03-07 (×69): 10 mg
  Filled 2019-02-18 (×69): qty 2

## 2019-02-18 MED ORDER — CLONAZEPAM 1 MG PO TABS
2.0000 mg | ORAL_TABLET | Freq: Three times a day (TID) | ORAL | Status: DC
  Administered 2019-02-18 – 2019-03-07 (×52): 2 mg
  Filled 2019-02-18 (×55): qty 2

## 2019-02-18 NOTE — Progress Notes (Addendum)
NAME:  Walter Oliver, MRN:  [id number], DOB:  17-Aug-1970, LOS: 5 ADMISSION DATE:  February 23, 2019, CONSULTATION DATE:  12/11 REFERRING MD:  Bonner Puna CHIEF COMPLAINT:  dyspnea   Brief History   48 year old male admitted on 12/8 to [facility], then moved to [facility] February 23, 2019 for severe acute respiratory failure with hypoxemia secondary to COVID-19 pneumonia.  History of present illness   This is a 48 year old male who came to [facility] with severe acute respiratory failure with hypoxemia due to COVID-19 pneumonia after he was found to have Covid on 12 5.  He was treated there with steroids and remdesivir and was transferred here for further management.  He was noted to have profoundly worsening hypoxemia on December 11 so he was moved to our facility for further evaluation.  There he has been placed on heated high flow oxygen.  He is breathing quickly but he says that he does not feel short of breath.  He does not have a cough.  He says that he would be willing to go on mechanical ventilation if necessary because he wants to do "anything that is necessary to return to his family"  Past Medical History  None listed  [facility] Events   12/8 admission 12/11 move to [facility]; completed CCP 12/11 12/13 intubated 12/14: Difficulty with ventilator synchrony, added propofol, continued benzodiazepine and narcotic infusions.  Placed in prone position. Actemra administered  Consults:  PCCM  Procedures:  Intubation 12/13 > PICC 12/15 >  Significant Diagnostic Tests:  12/13 CTA head > Negative CTA of the head and neck. No large vessel occlusion, hemodynamically significant stenosis, or other acute vascular abnormality. 12/13 CTA chest > no definite PE was identified, but study was limited by poor contrast timing. Diffuse bilateral ground-glass airspace opacities involving all lobes.  Micro Data:  12/5 SARS COV 2 > positive 12/13 blood >   Antimicrobials/COVID Rx  12/7  decadron x1 12/8 methylpred >  12/7 remdesivir >    Interim history/subjective:  Prone currently No acute events overnight PRVC, 460, rate 22, 100%, 16 PEEP  Objective   Blood pressure 114/69, pulse 86, temperature 98.1 F (36.7 C), temperature source Axillary, resp. rate (!) 22, height 6' (1.829 m), weight 114.8 kg, SpO2 98 %.    Vent Mode: PRVC FiO2 (%):  [90 %-100 %] 90 % Set Rate:  [18 bmp-22 bmp] 22 bmp Vt Set:  [460 mL] 460 mL PEEP:  [16 cmH20] 16 cmH20 Plateau Pressure:  [25 IPJ82-50 cmH20] 28 cmH20   Intake/Output Summary (Last 24 hours) at 02/18/2019 1115 Last data filed at 02/18/2019 1000 Gross per 24 hour  Intake 1022.74 ml  Output 3000 ml  Net -1977.26 ml   Filed Weights   02-23-2019 1730  Weight: 114.8 kg    Examination:  General: Obese male prone on vent HENT: orally intubated no visible pressure injury to R side of face.  Pulm: equal chest rise. Scattered rhonchi. Pplat 25. Still on PEEP 16 and 100% Card: unable to assess abd: Unable to assess.  Ext no acute deformity Neuro sedated RASS -4  Resolved Hospital Problem list     Assessment & Plan:   Acute hypoxic respiratory failure with ARDS due to COVID-19 pneumonia Plan Continuing ARDS protocol at a volume goal 6 mL/kg ideal body weight Target plateau pressure less than 30 cmH2O Target driving pressure less than 15 cmH2O Target PaO2 55-65 adjusting PEEP/FiO2 per protocol CVP monitoring. Goal < 4. Continuing extended remdesivir through 12/17 Continuing  IV steroids Continue to trend inflammatory markers PAD protocol with RASS goal negative for Continue clonazepam and oxycodone as well as current propofol, Versed, and Dilaudid infusion  Hyperglycemia: with no history of DM. On high dose methylprednisolone  Continue steroids. CBG monitoring and SSI.   Elevated LFTs: improved follow labs while on remdesivir  Hyperkalemia and intermittent fluid and electrolyte imbalance Diurese and repeat BMP  in AM  History of hypertension Amlodipine scheduled PRN hydralazine, metoprolol   History of anxiety Consider when hopefully able to wean sedation.    Best practice:  DVT prophylaxis: Lovenox 0.5 mg/kg every 12 CODE STATUS full Diet: Start tube feeds Family: Son Windy Fast updated 12/16  Sedation: IV sedation PUD prophylaxis: Pepcid  40 mins critical care time needed due to ARDS, COVID-19 pneumonia  Joneen Roach, AGACNP-BC [place] [facility]  See Amion for personal pager PCCM on call pager [phone]  02/18/2019 11:38 AM

## 2019-02-18 NOTE — Progress Notes (Signed)
Attending: Seen and examined independently, discussed with Georgann Housekeeper on rounds   Subjective: Prone overnight Diuresed well paO2 much better in prone position  Objective: Vitals:   02/18/19 0700 02/18/19 0730 02/18/19 0800 02/18/19 0830  BP: 116/69 110/71 114/68 114/69  Pulse: 88 88 88 86  Resp: (!) 22 (!) 22 (!) 22 (!) 22  Temp:   98.1 F (36.7 C)   TempSrc:   Axillary   SpO2: 100% 99% 99% 98%  Weight:      Height:       Vent Mode: PRVC FiO2 (%):  [90 %-100 %] 90 % Set Rate:  [18 bmp-22 bmp] 22 bmp Vt Set:  [460 mL] 460 mL PEEP:  [16 cmH20] 16 cmH20 Plateau Pressure:  [25 cmH20-28 cmH20] 28 cmH20  Intake/Output Summary (Last 24 hours) at 02/18/2019 1138 Last data filed at 02/18/2019 1000 Gross per 24 hour  Intake 1022.74 ml  Output 3000 ml  Net -1977.26 ml    General:  In bed on vent, prone HENT: NCAT ETT in place PULM: Crackles bases, vent supported breathing CV: prone GI: prone MSK: normal bulk and tone Neuro: sedated on vent     CBC    Component Value Date/Time   WBC 7.4 02/18/2019 0450   RBC 4.23 02/18/2019 0450   HGB 12.8 (L) 02/18/2019 0450   HCT 40.5 02/18/2019 0450   PLT 360 02/18/2019 0450   MCV 95.7 02/18/2019 0450   MCH 30.3 02/18/2019 0450   MCHC 31.6 02/18/2019 0450   RDW 12.7 02/18/2019 0450   LYMPHSABS 0.2 (L) 02/18/2019 0450   MONOABS 0.2 02/18/2019 0450   EOSABS 0.0 02/18/2019 0450   BASOSABS 0.0 02/18/2019 0450    BMET    Component Value Date/Time   NA 144 02/18/2019 0450   K 5.3 (H) 02/18/2019 0450   CL 99 02/18/2019 0450   CO2 36 (H) 02/18/2019 0450   GLUCOSE 235 (H) 02/18/2019 0450   BUN 33 (H) 02/18/2019 0450   CREATININE 0.96 02/18/2019 0450   CALCIUM 8.2 (L) 02/18/2019 0450   GFRNONAA >60 02/18/2019 0450   GFRAA >60 02/18/2019 0450    CXR images none today  Impression/Plan: ARDS due to COVID pneumonia: check supine ABG this evening, continue ARDS protocol targeting plateau < 30, driving < 15, low TVol,  diurese, continue remdesivir Sedation needs: increase oral med dosing, continue PAD protocol, intermittent paralytic  Rest per NP note   My cc time 35 minutes  Roselie Awkward, MD [place] PCCM Pager: [phone] Cell: [phone] After 3pm or if no response, call [phone]

## 2019-02-18 NOTE — Progress Notes (Signed)
Pt returned to supine position with RT x2 and RN x4. Cloth tape removed and hollister placed on pt. ETT remains secure at 24cm. Pt tolerated well, RT will continue to monitor.

## 2019-02-18 NOTE — Procedures (Signed)
Cortrak  Person Inserting Tube:  Maylon Peppers C, RD Tube Type:  Cortrak - 43 inches Tube Location:  Left nare Initial Placement:  Stomach Secured by: Bridle Technique Used to Measure Tube Placement:  Documented cm marking at nare/ corner of mouth Cortrak Secured At:  65 cm    Cortrak Tube Team Note:  Consult received to place a Cortrak feeding tube.  Bridle placed hanging loose to assist with proning.   No x-ray is required. RN may begin using tube.   If the tube becomes dislodged please keep the tube and contact the Cortrak team at [email or web address] (password [email or web address]) for replacement.  If after hours and replacement cannot be delayed, place a NG tube and confirm placement with an abdominal x-ray.    [place], [place], [place] Pager [phone] After Hours Pager

## 2019-02-18 NOTE — Progress Notes (Signed)
Placed patient in prone position with tube secured at 24cm at the right lip. Patient placed on 100% oxygen for procedure. Patient remained stable during procedure.

## 2019-02-18 NOTE — Progress Notes (Signed)
Supine ABG results given to Dr. Lake Bells, verbal order to prone at appropriate time.

## 2019-02-19 ENCOUNTER — Inpatient Hospital Stay (HOSPITAL_COMMUNITY): Payer: HRSA Program

## 2019-02-19 DIAGNOSIS — J80 Acute respiratory distress syndrome: Secondary | ICD-10-CM

## 2019-02-19 DIAGNOSIS — R7989 Other specified abnormal findings of blood chemistry: Secondary | ICD-10-CM

## 2019-02-19 LAB — POCT I-STAT 7, (LYTES, BLD GAS, ICA,H+H)
Acid-Base Excess: 19 mmol/L — ABNORMAL HIGH (ref 0.0–2.0)
Bicarbonate: 47.6 mmol/L — ABNORMAL HIGH (ref 20.0–28.0)
Calcium, Ion: 1.16 mmol/L (ref 1.15–1.40)
HCT: 37 % — ABNORMAL LOW (ref 39.0–52.0)
Hemoglobin: 12.6 g/dL — ABNORMAL LOW (ref 13.0–17.0)
O2 Saturation: 95 %
Patient temperature: 98.6
Potassium: 4.3 mmol/L (ref 3.5–5.1)
Sodium: 144 mmol/L (ref 135–145)
TCO2: 50 mmol/L — ABNORMAL HIGH (ref 22–32)
pCO2 arterial: 72.5 mmHg (ref 32.0–48.0)
pH, Arterial: 7.425 (ref 7.350–7.450)
pO2, Arterial: 77 mmHg — ABNORMAL LOW (ref 83.0–108.0)

## 2019-02-19 LAB — CBC WITH DIFFERENTIAL/PLATELET
Abs Immature Granulocytes: 0.04 10*3/uL (ref 0.00–0.07)
Basophils Absolute: 0 10*3/uL (ref 0.0–0.1)
Basophils Relative: 0 %
Eosinophils Absolute: 0 10*3/uL (ref 0.0–0.5)
Eosinophils Relative: 0 %
HCT: 40 % (ref 39.0–52.0)
Hemoglobin: 12.4 g/dL — ABNORMAL LOW (ref 13.0–17.0)
Immature Granulocytes: 1 %
Lymphocytes Relative: 3 %
Lymphs Abs: 0.3 10*3/uL — ABNORMAL LOW (ref 0.7–4.0)
MCH: 30 pg (ref 26.0–34.0)
MCHC: 31 g/dL (ref 30.0–36.0)
MCV: 96.6 fL (ref 80.0–100.0)
Monocytes Absolute: 0.2 10*3/uL (ref 0.1–1.0)
Monocytes Relative: 3 %
Neutro Abs: 6.7 10*3/uL (ref 1.7–7.7)
Neutrophils Relative %: 93 %
Platelets: 322 10*3/uL (ref 150–400)
RBC: 4.14 MIL/uL — ABNORMAL LOW (ref 4.22–5.81)
RDW: 13 % (ref 11.5–15.5)
WBC: 7.3 10*3/uL (ref 4.0–10.5)
nRBC: 0 % (ref 0.0–0.2)

## 2019-02-19 LAB — GLUCOSE, CAPILLARY
Glucose-Capillary: 228 mg/dL — ABNORMAL HIGH (ref 70–99)
Glucose-Capillary: 171 mg/dL — ABNORMAL HIGH (ref 70–99)
Glucose-Capillary: 177 mg/dL — ABNORMAL HIGH (ref 70–99)
Glucose-Capillary: 233 mg/dL — ABNORMAL HIGH (ref 70–99)
Glucose-Capillary: 203 mg/dL — ABNORMAL HIGH (ref 70–99)
Glucose-Capillary: 205 mg/dL — ABNORMAL HIGH (ref 70–99)
Glucose-Capillary: 183 mg/dL — ABNORMAL HIGH (ref 70–99)

## 2019-02-19 LAB — COMPREHENSIVE METABOLIC PANEL
ALT: 46 U/L — ABNORMAL HIGH (ref 0–44)
AST: 31 U/L (ref 15–41)
Albumin: 2.3 g/dL — ABNORMAL LOW (ref 3.5–5.0)
Alkaline Phosphatase: 61 U/L (ref 38–126)
Anion gap: 9 (ref 5–15)
BUN: 40 mg/dL — ABNORMAL HIGH (ref 6–20)
CO2: 40 mmol/L — ABNORMAL HIGH (ref 22–32)
Calcium: 7.9 mg/dL — ABNORMAL LOW (ref 8.9–10.3)
Chloride: 97 mmol/L — ABNORMAL LOW (ref 98–111)
Creatinine, Ser: 0.86 mg/dL (ref 0.61–1.24)
GFR calc Af Amer: >60 mL/min (ref >60)
GFR calc non Af Amer: >60 mL/min (ref >60)
Glucose, Bld: 225 mg/dL — ABNORMAL HIGH (ref 70–99)
Potassium: 4.6 mmol/L (ref 3.5–5.1)
Sodium: 146 mmol/L — ABNORMAL HIGH (ref 135–145)
Total Bilirubin: 0.5 mg/dL (ref 0.3–1.2)
Total Protein: 5.7 g/dL — ABNORMAL LOW (ref 6.5–8.1)

## 2019-02-19 LAB — PHOSPHORUS: Phosphorus: 3.2 mg/dL (ref 2.5–4.6)

## 2019-02-19 LAB — TRIGLYCERIDES: Triglycerides: 318 mg/dL — ABNORMAL HIGH (ref <150)

## 2019-02-19 LAB — MAGNESIUM: Magnesium: 2.9 mg/dL — ABNORMAL HIGH (ref 1.7–2.4)

## 2019-02-19 MED ORDER — INSULIN DETEMIR 100 UNIT/ML ~~LOC~~ SOLN
6.0000 [IU] | Freq: Every day | SUBCUTANEOUS | Status: DC
  Administered 2019-02-19: 6 [IU] via SUBCUTANEOUS
  Filled 2019-02-19 (×2): qty 0.06

## 2019-02-19 MED ORDER — PROPOFOL 1000 MG/100ML IV EMUL
0.0000 ug/kg/min | INTRAVENOUS | Status: DC
  Administered 2019-02-19 – 2019-02-20 (×3): 15 ug/kg/min via INTRAVENOUS
  Filled 2019-02-19 (×3): qty 100

## 2019-02-19 MED ORDER — FUROSEMIDE 10 MG/ML IJ SOLN
40.0000 mg | Freq: Two times a day (BID) | INTRAMUSCULAR | Status: DC
  Administered 2019-02-19 – 2019-02-20 (×4): 40 mg via INTRAVENOUS
  Filled 2019-02-19 (×4): qty 4

## 2019-02-19 MED ORDER — FREE WATER
200.0000 mL | Freq: Four times a day (QID) | Status: DC
  Administered 2019-02-19 – 2019-02-20 (×4): 200 mL

## 2019-02-19 MED ORDER — AMLODIPINE BESYLATE 5 MG PO TABS
5.0000 mg | ORAL_TABLET | Freq: Every day | ORAL | Status: DC
  Administered 2019-02-19: 5 mg via ORAL
  Filled 2019-02-19: qty 1

## 2019-02-19 MED ORDER — INSULIN DETEMIR 100 UNIT/ML FLEXPEN: 6.0000 [IU] | Freq: Every day | SUBCUTANEOUS | Status: DC

## 2019-02-19 MED ORDER — ASCORBIC ACID 500 MG PO TABS
500.0000 mg | ORAL_TABLET | Freq: Every day | ORAL | Status: DC
  Filled 2019-02-19 (×2): qty 1

## 2019-02-19 NOTE — Progress Notes (Signed)
Pt unproned at this time with no complications. VS within normal limits. ETT tube holder changed and secured with et in proper position

## 2019-02-19 NOTE — Progress Notes (Signed)
   02/19/19 1400  Clinical Encounter Type  Visited With Family;Other (Comment) (Son)  Visit Type Initial;Psychological support;Spiritual support  Referral From Nurse  Consult/Referral To Chaplain  Spiritual Encounters  Spiritual Needs Emotional;Other (Comment) (Spiritual Care conversation/Support)  Stress Factors  Family Stress Factors Health changes   I spoke with Mr. Thebeau son, Jori Moll. He stated that he is extremely pleased with all of the communications that he has received from the hospital about his dad's condition. He stated that he feels informed about everything that is going on. He did not identify any spiritual needs at this time.   Please, contact Spiritual Care for further assistance.   Chaplain Shanon Ace M.Div., [facility]

## 2019-02-19 NOTE — Progress Notes (Signed)
Pt ETT holister removed and resecured with cloth tape at 24cm at the lip. Protective barriers placed prior. Pt prone with RT x2 and RN x3. Pt tolerated well, RT will continue to monitor.

## 2019-02-19 NOTE — Progress Notes (Signed)
I inventoried patient s belongings at the bedside: 1 pair of flip flops,2 pair of pants, one belt, 3 shirts, 2 pair of socks, 3 pair of underwear.  There is a wallet with 2 credit cards and a debit card, a cell phone and charger all sent to security. Walter Oliver

## 2019-02-19 NOTE — Progress Notes (Addendum)
NAME:  Walter Oliver, MRN:  [id number], DOB:  01-Dec-1970, LOS: 6 ADMISSION DATE:  02/18/2019, CONSULTATION DATE:  12/11 REFERRING MD:  Jarvis Newcomer CHIEF COMPLAINT:  dyspnea   Brief History   48 year old male admitted on 12/8 to [facility], then moved to [facility] February 13, 2019 for severe acute respiratory failure with hypoxemia secondary to COVID-19 pneumonia.   Past Medical History  None listed  Significant Hospital Events   12/8 admission 12/11 move to [facility]; completed CCP 12/11 12/13 intubated 12/14: Difficulty with ventilator synchrony, added propofol, continued benzodiazepine and narcotic infusions.  Placed in prone position. Actemra administered  Consults:  PCCM  Procedures:  Intubation 12/13 > PICC 12/15 >  Significant Diagnostic Tests:  12/13 CTA head > Negative CTA of the head and neck. No large vessel occlusion, hemodynamically significant stenosis, or other acute vascular abnormality. 12/13 CTA chest > no definite PE was identified, but study was limited by poor contrast timing. Diffuse bilateral ground-glass airspace opacities involving all lobes.  Micro Data:  12/5 SARS COV 2 > positive 12/13 blood >   Antimicrobials/COVID Rx  12/7 decadron x1 12/8 methylpred >  12/7 remdesivir >    Interim history/subjective:  Placed in prone position last night at 11:43 PM   Objective   Blood pressure 110/68, pulse 80, temperature 98.8 F (37.1 C), temperature source Oral, resp. rate (Abnormal) 21, height 6' (1.829 m), weight 116.5 kg, SpO2 97 %.    Vent Mode: PRVC FiO2 (%):  [80 %-90 %] 80 % Set Rate:  [22 bmp] 22 bmp Vt Set:  [460 mL] 460 mL PEEP:  [16 cmH20] 16 cmH20 Plateau Pressure:  [33 cmH20] 33 cmH20   Intake/Output Summary (Last 24 hours) at 02/19/2019 0847 Last data filed at 02/19/2019 0800 Gross per 24 hour  Intake 2047.28 ml  Output 1940 ml  Net 107.28 ml   Filed Weights   02/28/2019 1730 02/19/19 0500  Weight: 114.8 kg 116.5 kg    Examination:  General:  48 year old male patient currently sedated, lying in the prone position HEENT orally intubated Pulmonary: Scattered rhonchi equal chest rise Ventilator assessment: PRVC current tidal volume 460, respiratory rate 22, PEEP 16, FiO2 70% with saturations 95 to 96% P plat:29 Driving pressure:9 Cardiac: Regular rate and rhythm Abdomen: Soft nontender tolerating tube feeds Extremities dependent edema appreciated today, pulses palpable, brisk capillary refill Neuro: Heavily sedated GU: Clear yellow urine  Resolved Hospital Problem list     Assessment & Plan:   Acute hypoxic respiratory failure with ARDS due to COVID-19 pneumonia Portable chest x-ray personally reviewed endotracheal tube is below the thoracic inlet, aeration looks worse particularly on the left, with worsening consolidative changes but really has progressed bilaterally Remains on high level of ventilatory support D-dimer has been trending down , CRP as well No fever spikes or significant change in white blood cell count Plan Continuing ARDS protocol with tidal volume goal of 60 mils per kilogram addicted body weight  Target plateau pressure less than 30 cmH2O  Target driving pressure less than 15 cmH2O  Target PaO2 55 to 65% adjusting PEEP and FiO2 per protocol  Target PaO2/FiO2 ratio less than 150, continuing prone position protocol  CVP goal less than 4  Remdesivir to be completed today, this reflects a an extended course  To complete 10 days of IV steroids  Continue to trend inflammatory markers  Continue Versed and Dilaudid infusion as well as scheduled clonazepam and oxycodone  Have changed ceiling on propofol with him to get  propofol discontinued over the next 24 hours or so, his triglycerides are starting to rise Push diuresis as blood pressure, BUN and creatinine will tolerate A.m. chest x-ray  Hyperglycemia: with no history of DM. On high dose methylprednisolone  Plan Continue sliding scale insulin Start  Levemir at 6 units daily  Elevated LFTs: improved Plan Repeat again in a.m.  intermittent fluid and electrolyte imbalance: Mild hyponatremia, potassium is improved, -1.9 L  fluid balance Plan Cont lasix as BP/BUN and cr allow  Add free water via tube  History of hypertension Plan Going to decrease Norvasc with goal of increasing diuretics Continue to trend blood pressure  History of anxiety Plan Currently heavily sedated   Best practice:  DVT prophylaxis: Lovenox 0.5 mg/kg every 12 CODE STATUS full Diet: Start tube feeds Family: Son Jori Moll updated 12/16  Sedation: IV sedation PUD prophylaxis: Pepcid  Disposition: Continue aggressive critical care support, prone protocol, remdesivir is on final day.  Adding diuresis.  My critical care time is 32 minutes  Erick Colace ACNP-BC [place] Pager # [phone] OR # [phone] if no answer

## 2019-02-19 NOTE — Progress Notes (Signed)
Updated patients son via phone. Ellamae Sia

## 2019-02-20 LAB — CBC WITH DIFFERENTIAL/PLATELET
Abs Immature Granulocytes: 0.08 10*3/uL — ABNORMAL HIGH (ref 0.00–0.07)
Basophils Absolute: 0 10*3/uL (ref 0.0–0.1)
Basophils Relative: 0 %
Eosinophils Absolute: 0.1 10*3/uL (ref 0.0–0.5)
Eosinophils Relative: 1 %
HCT: 43 % (ref 39.0–52.0)
Hemoglobin: 13.5 g/dL (ref 13.0–17.0)
Immature Granulocytes: 1 %
Lymphocytes Relative: 5 %
Lymphs Abs: 0.3 10*3/uL — ABNORMAL LOW (ref 0.7–4.0)
MCH: 30.3 pg (ref 26.0–34.0)
MCHC: 31.4 g/dL (ref 30.0–36.0)
MCV: 96.6 fL (ref 80.0–100.0)
Monocytes Absolute: 0.2 10*3/uL (ref 0.1–1.0)
Monocytes Relative: 4 %
Neutro Abs: 4.9 10*3/uL (ref 1.7–7.7)
Neutrophils Relative %: 89 %
Platelets: 333 10*3/uL (ref 150–400)
RBC: 4.45 MIL/uL (ref 4.22–5.81)
RDW: 13.1 % (ref 11.5–15.5)
WBC: 5.6 10*3/uL (ref 4.0–10.5)
nRBC: 0 % (ref 0.0–0.2)

## 2019-02-20 LAB — COMPREHENSIVE METABOLIC PANEL
ALT: 63 U/L — ABNORMAL HIGH (ref 0–44)
AST: 45 U/L — ABNORMAL HIGH (ref 15–41)
Albumin: 2.6 g/dL — ABNORMAL LOW (ref 3.5–5.0)
Alkaline Phosphatase: 63 U/L (ref 38–126)
Anion gap: 10 (ref 5–15)
BUN: 48 mg/dL — ABNORMAL HIGH (ref 6–20)
CO2: 42 mmol/L — ABNORMAL HIGH (ref 22–32)
Calcium: 8.3 mg/dL — ABNORMAL LOW (ref 8.9–10.3)
Chloride: 96 mmol/L — ABNORMAL LOW (ref 98–111)
Creatinine, Ser: 0.94 mg/dL (ref 0.61–1.24)
GFR calc Af Amer: >60 mL/min (ref >60)
GFR calc non Af Amer: >60 mL/min (ref >60)
Glucose, Bld: 202 mg/dL — ABNORMAL HIGH (ref 70–99)
Potassium: 4.6 mmol/L (ref 3.5–5.1)
Sodium: 148 mmol/L — ABNORMAL HIGH (ref 135–145)
Total Bilirubin: 0.5 mg/dL (ref 0.3–1.2)
Total Protein: 6.1 g/dL — ABNORMAL LOW (ref 6.5–8.1)

## 2019-02-20 LAB — TRIGLYCERIDES: Triglycerides: 261 mg/dL — ABNORMAL HIGH (ref <150)

## 2019-02-20 LAB — GLUCOSE, CAPILLARY
Glucose-Capillary: 235 mg/dL — ABNORMAL HIGH (ref 70–99)
Glucose-Capillary: 212 mg/dL — ABNORMAL HIGH (ref 70–99)
Glucose-Capillary: 250 mg/dL — ABNORMAL HIGH (ref 70–99)
Glucose-Capillary: 272 mg/dL — ABNORMAL HIGH (ref 70–99)
Glucose-Capillary: 238 mg/dL — ABNORMAL HIGH (ref 70–99)

## 2019-02-20 MED ORDER — FREE WATER
200.0000 mL | Status: DC
  Administered 2019-02-20 – 2019-02-21 (×6): 200 mL

## 2019-02-20 MED ORDER — METHYLPREDNISOLONE SODIUM SUCC 40 MG IJ SOLR
40.0000 mg | Freq: Every day | INTRAMUSCULAR | Status: AC
  Administered 2019-02-21 – 2019-02-24 (×4): 40 mg via INTRAVENOUS
  Filled 2019-02-20 (×4): qty 1

## 2019-02-20 MED ORDER — DOCUSATE SODIUM 50 MG/5ML PO LIQD
100.0000 mg | Freq: Two times a day (BID) | ORAL | Status: DC
  Administered 2019-02-20 – 2019-02-25 (×11): 100 mg via ORAL
  Filled 2019-02-20 (×12): qty 10

## 2019-02-20 MED ORDER — FAMOTIDINE 40 MG/5ML PO SUSR
20.0000 mg | Freq: Two times a day (BID) | ORAL | Status: DC
  Administered 2019-02-20 – 2019-03-07 (×31): 20 mg
  Filled 2019-02-20 (×32): qty 2.5

## 2019-02-20 MED ORDER — INSULIN DETEMIR 100 UNIT/ML ~~LOC~~ SOLN
10.0000 [IU] | Freq: Every day | SUBCUTANEOUS | Status: DC
  Administered 2019-02-20 – 2019-02-21 (×2): 10 [IU] via SUBCUTANEOUS
  Filled 2019-02-20 (×3): qty 0.1

## 2019-02-20 MED ORDER — PROPOFOL 1000 MG/100ML IV EMUL
0.0000 ug/kg/min | INTRAVENOUS | Status: DC
  Administered 2019-02-20 (×2): 15 ug/kg/min via INTRAVENOUS
  Filled 2019-02-20 (×2): qty 100

## 2019-02-20 MED ORDER — ASCORBIC ACID 500 MG PO TABS
500.0000 mg | ORAL_TABLET | Freq: Every day | ORAL | Status: DC
  Administered 2019-02-20 – 2019-03-07 (×16): 500 mg
  Filled 2019-02-20 (×16): qty 1

## 2019-02-20 MED ORDER — ZINC SULFATE 220 (50 ZN) MG PO CAPS
220.0000 mg | ORAL_CAPSULE | Freq: Every day | ORAL | Status: DC
  Administered 2019-02-20 – 2019-03-07 (×16): 220 mg
  Filled 2019-02-20 (×15): qty 1

## 2019-02-20 MED ORDER — AMLODIPINE BESYLATE 5 MG PO TABS
5.0000 mg | ORAL_TABLET | Freq: Every day | ORAL | Status: DC
  Administered 2019-02-20: 5 mg

## 2019-02-20 NOTE — Progress Notes (Signed)
Call made to pts son. Update given on pt condition and plan of care.

## 2019-02-20 NOTE — Progress Notes (Signed)
Pt transported from 207-4 to 794-3 with no complications. VS stable throughout

## 2019-02-20 NOTE — Progress Notes (Addendum)
NAME:  Walter Oliver, MRN:  [id number], DOB:  1970/11/30, LOS: 7 ADMISSION DATE:  02/18/2019, CONSULTATION DATE:  12/11 REFERRING MD:  Jarvis Newcomer CHIEF COMPLAINT:  dyspnea   Brief History   48 year old male admitted on 12/8 to [facility], then moved to [facility] 02/18/19 for severe acute respiratory failure with hypoxemia secondary to COVID-19 pneumonia.   Past Medical History  None listed  Significant Hospital Events   12/8 admission 12/11 move to [facility]; completed CCP 12/11 12/13 intubated 12/14: Difficulty with ventilator synchrony, added propofol, continued benzodiazepine and narcotic infusions.  Placed in prone position. Actemra administered  Consults:  PCCM  Procedures:  Intubation 12/13 > PICC 12/15 >  Significant Diagnostic Tests:  12/13 CTA head > Negative CTA of the head and neck. No large vessel occlusion, hemodynamically significant stenosis, or other acute vascular abnormality. 12/13 CTA chest > no definite PE was identified, but study was limited by poor contrast timing. Diffuse bilateral ground-glass airspace opacities involving all lobes.  Micro Data:  12/5 SARS COV 2 > positive 12/13 blood >   Antimicrobials/COVID Rx  12/7 decadron x1 12/8 methylpred  12/7 remdesivir >  Completed 10 days  Interim history/subjective:  Currently back in prone position    Objective   Blood pressure 108/72, pulse 96, temperature 100.2 F (37.9 C), resp. rate 20, height 6' (1.829 m), weight 115.1 kg, SpO2 93 %.    Vent Mode: PRVC FiO2 (%):  [70 %] 70 % Set Rate:  [22 bmp] 22 bmp Vt Set:  [460 mL] 460 mL PEEP:  [16 cmH20] 16 cmH20 Plateau Pressure:  [23 cmH20-29 cmH20] 29 cmH20   Intake/Output Summary (Last 24 hours) at 02/20/2019 0807 Last data filed at 02/20/2019 0431 Gross per 24 hour  Intake 2275.91 ml  Output 2115 ml  Net 160.91 ml   Filed Weights   18-Feb-2019 1730 02/19/19 0500 02/20/19 0436  Weight: 114.8 kg 116.5 kg 115.1 kg    Examination:  General:  Sedated 48 year old male patient currently on full ventilator support HEENT normocephalic atraumatic orally intubated Pulmonary: Equal chest rise bilaterally faint rales Ventilator settings: Tidal volume 460, PEEP 16, respiratory rate 22, FiO2 70% Current PPlat: 26 Current driving pressure: 10 Sats: 93% Cardiac regular rate and rhythm  abdomen soft nontender Extremities: Warm and dry dependent edema Neuro: Sedated heavily Resolved Hospital Problem list     Assessment & Plan:   Acute hypoxic respiratory failure with ARDS due to COVID-19 pneumonia  Plan Repeat inflammatory markers in a.m.  continue ARDS protocol with goal tidal volume 6 mL/kg predicted body weight  Target plateau pressure less than 30 cmH2O  Target driving pressure less than 15 cmH2O  Target PaO2 goal 55 to 65% we will adjust PEEP and FiO2 per protocol  Target PaO2/FiO2 ratio greater than 150, continuing proning protocol until his goal is achieved  CVP goal less than 4  RASS goal -4 to -5, with goal to ensure ventilator synchrony Continuing heavy sedation regimen: Currently on Versed, Dilaudid and propofol, triglycerides had began to rise so our goal is to get him off propofol today Started clonazepam and oxycodone on 12/17 to achieve this Continuing diuresis A.m. chest x-ray VAP bundle Complete 10 days of steroids.  Trend fever and white blood cell curve closely in surveillance for nosocomial infection  Hyperglycemia: with no history of DM. On high dose methylprednisolone  Plan Continue sliding scale Increase Levemir from 6 to 10 units  Elevated LFTs Plan Repeat in a.m.  intermittent fluid and  electrolyte imbalance: Mild hyponatremia, - 2.5 L  fluid balance Plan adjust free water, increased from 200 every 6 hours to 200 every 4 hours We will repeat Lasix again today, may need to back off from this on 12/19 showed sodium continue to rise, otherwise we will make BUN/creatinine and blood pressure limiting  factor to dosing Continue strict intake and output Repeat a.m. chemistry  History of hypertension Plan Continue Norvasc at 5 mg daily Continuing diuresis  History of anxiety Plan Continue current heavy sedation regimen  Best practice:  DVT prophylaxis: Lovenox 0.5 mg/kg every 12 CODE STATUS full Diet: Start tube feeds Family: Son Jori Moll updated 12/16  Sedation: IV sedation PUD prophylaxis: Pepcid  Disposition: Continue aggressive critical care support, prone protocol, remdesivir is on final day.  Adding diuresis.  My cct 57 minutes  Erick Colace ACNP-BC [place] Pager # [phone] OR # [phone] if no answer

## 2019-02-20 NOTE — Progress Notes (Signed)
Son Jori Moll updated by phone, all questions answered.

## 2019-02-20 NOTE — Progress Notes (Signed)
Pt head and arms repositioned. ETT remains secured at 24cm. Pt tolerated well, RT will continue to monitor.

## 2019-02-20 NOTE — Progress Notes (Signed)
Patient placed in supine position.  ETT re-secured with commercial tube holder.  No breakdown noted.  Patient tolerated well with no complications.  

## 2019-02-21 ENCOUNTER — Inpatient Hospital Stay (HOSPITAL_COMMUNITY): Payer: HRSA Program

## 2019-02-21 LAB — COMPREHENSIVE METABOLIC PANEL
ALT: 77 U/L — ABNORMAL HIGH (ref 0–44)
AST: 52 U/L — ABNORMAL HIGH (ref 15–41)
Albumin: 2.6 g/dL — ABNORMAL LOW (ref 3.5–5.0)
Alkaline Phosphatase: 70 U/L (ref 38–126)
Anion gap: 11 (ref 5–15)
BUN: 65 mg/dL — ABNORMAL HIGH (ref 6–20)
CO2: 44 mmol/L — ABNORMAL HIGH (ref 22–32)
Calcium: 8.5 mg/dL — ABNORMAL LOW (ref 8.9–10.3)
Chloride: 93 mmol/L — ABNORMAL LOW (ref 98–111)
Creatinine, Ser: 0.98 mg/dL (ref 0.61–1.24)
GFR calc Af Amer: >60 mL/min (ref >60)
GFR calc non Af Amer: >60 mL/min (ref >60)
Glucose, Bld: 162 mg/dL — ABNORMAL HIGH (ref 70–99)
Potassium: 3.8 mmol/L (ref 3.5–5.1)
Sodium: 148 mmol/L — ABNORMAL HIGH (ref 135–145)
Total Bilirubin: 0.7 mg/dL (ref 0.3–1.2)
Total Protein: 5.9 g/dL — ABNORMAL LOW (ref 6.5–8.1)

## 2019-02-21 LAB — CULTURE, BLOOD (ROUTINE X 2)
Culture: NO GROWTH
Culture: NO GROWTH
Special Requests: ADEQUATE
Special Requests: ADEQUATE

## 2019-02-21 LAB — TRIGLYCERIDES: Triglycerides: 341 mg/dL — ABNORMAL HIGH (ref <150)

## 2019-02-21 LAB — D-DIMER, QUANTITATIVE: D-Dimer, Quant: 2.81 ug/mL-FEU — ABNORMAL HIGH (ref 0.00–0.50)

## 2019-02-21 LAB — CBC WITH DIFFERENTIAL/PLATELET
Abs Immature Granulocytes: 0.2 10*3/uL — ABNORMAL HIGH (ref 0.00–0.07)
Basophils Absolute: 0 10*3/uL (ref 0.0–0.1)
Basophils Relative: 1 %
Eosinophils Absolute: 0.2 10*3/uL (ref 0.0–0.5)
Eosinophils Relative: 3 %
HCT: 44 % (ref 39.0–52.0)
Hemoglobin: 13.8 g/dL (ref 13.0–17.0)
Immature Granulocytes: 3 %
Lymphocytes Relative: 17 %
Lymphs Abs: 1.1 10*3/uL (ref 0.7–4.0)
MCH: 30.1 pg (ref 26.0–34.0)
MCHC: 31.4 g/dL (ref 30.0–36.0)
MCV: 95.9 fL (ref 80.0–100.0)
Monocytes Absolute: 0.2 10*3/uL (ref 0.1–1.0)
Monocytes Relative: 3 %
Neutro Abs: 4.7 10*3/uL (ref 1.7–7.7)
Neutrophils Relative %: 73 %
Platelets: 293 10*3/uL (ref 150–400)
RBC: 4.59 MIL/uL (ref 4.22–5.81)
RDW: 13.4 % (ref 11.5–15.5)
WBC: 6.5 10*3/uL (ref 4.0–10.5)
nRBC: 0 % (ref 0.0–0.2)

## 2019-02-21 LAB — GLUCOSE, CAPILLARY
Glucose-Capillary: 153 mg/dL — ABNORMAL HIGH (ref 70–99)
Glucose-Capillary: 161 mg/dL — ABNORMAL HIGH (ref 70–99)
Glucose-Capillary: 174 mg/dL — ABNORMAL HIGH (ref 70–99)
Glucose-Capillary: 193 mg/dL — ABNORMAL HIGH (ref 70–99)
Glucose-Capillary: 195 mg/dL — ABNORMAL HIGH (ref 70–99)
Glucose-Capillary: 197 mg/dL — ABNORMAL HIGH (ref 70–99)
Glucose-Capillary: 270 mg/dL — ABNORMAL HIGH (ref 70–99)

## 2019-02-21 LAB — POCT I-STAT 7, (LYTES, BLD GAS, ICA,H+H)
Acid-Base Excess: 11 mmol/L — ABNORMAL HIGH (ref 0.0–2.0)
Bicarbonate: 38.7 mmol/L — ABNORMAL HIGH (ref 20.0–28.0)
Calcium, Ion: 1.18 mmol/L (ref 1.15–1.40)
HCT: 44 % (ref 39.0–52.0)
Hemoglobin: 15 g/dL (ref 13.0–17.0)
O2 Saturation: 93 %
Patient temperature: 99.1
Potassium: 4.1 mmol/L (ref 3.5–5.1)
Sodium: 142 mmol/L (ref 135–145)
TCO2: 41 mmol/L — ABNORMAL HIGH (ref 22–32)
pCO2 arterial: 61.6 mmHg — ABNORMAL HIGH (ref 32.0–48.0)
pH, Arterial: 7.407 (ref 7.350–7.450)
pO2, Arterial: 71 mmHg — ABNORMAL LOW (ref 83.0–108.0)

## 2019-02-21 LAB — LACTATE DEHYDROGENASE: LDH: 448 U/L — ABNORMAL HIGH (ref 98–192)

## 2019-02-21 LAB — FERRITIN: Ferritin: 282 ng/mL (ref 24–336)

## 2019-02-21 LAB — C-REACTIVE PROTEIN: CRP: 1.6 mg/dL — ABNORMAL HIGH (ref <1.0)

## 2019-02-21 MED ORDER — SODIUM CHLORIDE 0.9 % IV SOLN
0.5000 mg/kg/h | INTRAVENOUS | Status: DC
  Administered 2019-02-21 – 2019-03-01 (×19): 0.5 mg/kg/h via INTRAVENOUS
  Filled 2019-02-21 (×48): qty 5

## 2019-02-21 MED ORDER — POTASSIUM CHLORIDE 20 MEQ/15ML (10%) PO SOLN
40.0000 meq | Freq: Once | ORAL | Status: AC
  Administered 2019-02-21: 09:00:00 40 meq via ORAL
  Filled 2019-02-21: qty 30

## 2019-02-21 MED ORDER — STERILE WATER FOR INJECTION IJ SOLN
INTRAMUSCULAR | Status: AC
  Filled 2019-02-21: qty 10

## 2019-02-21 MED ORDER — SODIUM CHLORIDE 0.9 % IV SOLN: 0.5000 mg/kg/h | INTRAVENOUS | Status: DC

## 2019-02-21 MED ORDER — FREE WATER
400.0000 mL | Status: DC
  Administered 2019-02-21 – 2019-03-05 (×72): 400 mL

## 2019-02-21 MED ORDER — INSULIN DETEMIR 100 UNIT/ML ~~LOC~~ SOLN
20.0000 [IU] | Freq: Every day | SUBCUTANEOUS | Status: DC
  Administered 2019-02-22 – 2019-03-07 (×14): 20 [IU] via SUBCUTANEOUS
  Filled 2019-02-21 (×14): qty 0.2

## 2019-02-21 MED ORDER — INSULIN DETEMIR 100 UNIT/ML ~~LOC~~ SOLN
10.0000 [IU] | Freq: Once | SUBCUTANEOUS | Status: AC
  Administered 2019-02-21: 10 [IU] via SUBCUTANEOUS
  Filled 2019-02-21: qty 0.1

## 2019-02-21 MED ORDER — ACETAZOLAMIDE SODIUM 500 MG IJ SOLR
500.0000 mg | Freq: Two times a day (BID) | INTRAMUSCULAR | Status: AC
  Administered 2019-02-21 (×2): 500 mg via INTRAVENOUS
  Filled 2019-02-21 (×2): qty 500

## 2019-02-21 MED ORDER — PHENYLEPHRINE HCL-NACL 10-0.9 MG/250ML-% IV SOLN: 0.0000 ug/min | INTRAVENOUS | Status: DC

## 2019-02-21 MED ORDER — POLYETHYLENE GLYCOL 3350 17 G PO PACK
17.0000 g | PACK | Freq: Two times a day (BID) | ORAL | Status: DC
  Administered 2019-02-21 – 2019-02-23 (×6): 17 g via ORAL
  Filled 2019-02-21 (×7): qty 1

## 2019-02-21 NOTE — Progress Notes (Signed)
Called and updated son.  Dan Rand Boller MD 

## 2019-02-21 NOTE — Progress Notes (Signed)
NAME:  Walter Oliver, MRN:  [id number], DOB:  January 10, 1971, LOS: 8 ADMISSION DATE:  03-03-2019, CONSULTATION DATE:  12/11 REFERRING MD:  Bonner Puna CHIEF COMPLAINT:  dyspnea   Brief History   48 year old male admitted on 12/8 to [facility] then moved to [facility] March 03, 2019 for severe acute respiratory failure with hypoxemia secondary to COVID-19 pneumonia.   Past Medical History  None listed  Significant Hospital Events   12/8 admission 12/11 move to [facility]; completed CCP 12/11 12/13 intubated 12/14: Difficulty with ventilator synchrony, added propofol, continued benzodiazepine and narcotic infusions.  Placed in prone position. Actemra administered  Consults:  PCCM  Procedures:  Intubation 12/13 > PICC 12/15 >  Significant Diagnostic Tests:  12/13 CTA head > Negative CTA of the head and neck. No large vessel occlusion, hemodynamically significant stenosis, or other acute vascular abnormality. 12/13 CTA chest > no definite PE was identified, but study was limited by poor contrast timing. Diffuse bilateral ground-glass airspace opacities involving all lobes.  Micro Data:  12/5 SARS COV 2 > positive 12/13 blood > NG  Antimicrobials/COVID Rx  12/7 decadron x1 12/8 methylpred  12/7 remdesivir >  Completed 10 days  Interim history/subjective:  Proned again this AM  Objective   Blood pressure 96/65, pulse (!) 104, temperature 99.5 F (37.5 C), resp. rate (!) 22, height 6' (1.829 m), weight 113.1 kg, SpO2 93 %.    Vent Mode: PRVC FiO2 (%):  [70 %-100 %] 80 % Set Rate:  [22 bmp] 22 bmp Vt Set:  [460 mL] 460 mL PEEP:  [16 cmH20] 16 cmH20 Plateau Pressure:  [20 cmH20-29 cmH20] 29 cmH20   Intake/Output Summary (Last 24 hours) at 02/21/2019 0816 Last data filed at 02/21/2019 0700 Gross per 24 hour  Intake 1959.36 ml  Output 3800 ml  Net -1840.64 ml   Filed Weights   02/19/19 0500 02/20/19 0436 02/21/19 0500  Weight: 116.5 kg 115.1 kg 113.1 kg    Examination:  GEN:  overweight man proned in bed HEENT: ETT in place with foul smelling secretions per RT CV: Tachycardic, ext warm PULM: Diminished but no wheezing or rhonci GI: Soft, +BS EXT: Trace edema NEURO: Withdraws to pain, heavily sedated PSYCH: RASS -3 SKIN: No rashes  Significant contraction alkalosis Sugars okay Persistent hyperntaremia BUN up to 65 from 40s TG 341 from 360 CBC stable and benign CXR continued airspace disease, ETT looks good   Resolved Hospital Problem list     Assessment & Plan:   # Acute hypoxic respiratory failure with ARDS due to COVID-19 pneumonia - ARDSnet PEEP/FiO2, attempt to limit driving pressures as allowed by lung compliance - VAP prevention bundle -18/6h proning with A/a gradients < 150, adjust based on clinical response -Sedation to minimize shearing forces created by ventilator assynchrony - Keep even as able with balanced diuresis as tolerated by renal function 12/19: wean off propofol to versed, continue proning, can add ketamine if run into issues, check sputum culture  # Hyperglycemia: improved with current regimen - continue levemir, SSI, watch closely as comes off steroids  # Elevated LFTs- likely related to covid infection +/- remdesivir, mild, trend  # Induced hypernatremia, contraction alkalosis - Stop lasix - Diamox x 2 doses - Monitor BMET - Increase free water  Best practice:  DVT prophylaxis: Lovenox 0.5 mg/kg every 12, d dimer looks okay CODE STATUS full Diet: Start tube feeds Family: will call Sedation:  See above PUD prophylaxis: Pepcid   The patient is critically ill with multiple organ  systems failure and requires high complexity decision making for assessment and support, frequent evaluation and titration of therapies, application of advanced monitoring technologies and extensive interpretation of multiple databases. Critical Care Time devoted to patient care services described in this note independent of APP/resident time  (if applicable)  is 37 minutes.   Myrla Halsted MD [place] [facility] Critical [facility] 02/21/2019 12:36 PM Personal pager: [phone] If unanswered, please page CCM On-call: #[phone]

## 2019-02-21 NOTE — Progress Notes (Signed)
Pt supined without complications.  Skin integrity is good.  ETT secured with commercial tube holder.

## 2019-02-21 NOTE — Progress Notes (Signed)
RT NOTE:  RT assisted with turning patient to prone position. Foam padding placed on cheeks, upper lip and forehead. ETT secured with cloth tape @ 24cm center lip. RT x2, RN x4. Oral care done. Prone pillow under patients head.

## 2019-02-22 DIAGNOSIS — J9601 Acute respiratory failure with hypoxia: Secondary | ICD-10-CM

## 2019-02-22 LAB — POCT I-STAT 7, (LYTES, BLD GAS, ICA,H+H)
Acid-Base Excess: 8 mmol/L — ABNORMAL HIGH (ref 0.0–2.0)
Bicarbonate: 35.5 mmol/L — ABNORMAL HIGH (ref 20.0–28.0)
Calcium, Ion: 1.26 mmol/L (ref 1.15–1.40)
HCT: 39 % (ref 39.0–52.0)
Hemoglobin: 13.3 g/dL (ref 13.0–17.0)
O2 Saturation: 89 %
Patient temperature: 98.1
Potassium: 4.1 mmol/L (ref 3.5–5.1)
Sodium: 144 mmol/L (ref 135–145)
TCO2: 37 mmol/L — ABNORMAL HIGH (ref 22–32)
pCO2 arterial: 58.3 mmHg — ABNORMAL HIGH (ref 32.0–48.0)
pH, Arterial: 7.391 (ref 7.350–7.450)
pO2, Arterial: 58 mmHg — ABNORMAL LOW (ref 83.0–108.0)

## 2019-02-22 LAB — COMPREHENSIVE METABOLIC PANEL
ALT: 87 U/L — ABNORMAL HIGH (ref 0–44)
AST: 50 U/L — ABNORMAL HIGH (ref 15–41)
Albumin: 2.5 g/dL — ABNORMAL LOW (ref 3.5–5.0)
Alkaline Phosphatase: 71 U/L (ref 38–126)
Anion gap: 10 (ref 5–15)
BUN: 54 mg/dL — ABNORMAL HIGH (ref 6–20)
CO2: 33 mmol/L — ABNORMAL HIGH (ref 22–32)
Calcium: 8.4 mg/dL — ABNORMAL LOW (ref 8.9–10.3)
Chloride: 102 mmol/L (ref 98–111)
Creatinine, Ser: 1.02 mg/dL (ref 0.61–1.24)
GFR calc Af Amer: >60 mL/min (ref >60)
GFR calc non Af Amer: >60 mL/min (ref >60)
Glucose, Bld: 190 mg/dL — ABNORMAL HIGH (ref 70–99)
Potassium: 3.4 mmol/L — ABNORMAL LOW (ref 3.5–5.1)
Sodium: 145 mmol/L (ref 135–145)
Total Bilirubin: 0.7 mg/dL (ref 0.3–1.2)
Total Protein: 5.7 g/dL — ABNORMAL LOW (ref 6.5–8.1)

## 2019-02-22 LAB — CBC WITH DIFFERENTIAL/PLATELET
Abs Immature Granulocytes: 0.19 10*3/uL — ABNORMAL HIGH (ref 0.00–0.07)
Basophils Absolute: 0 10*3/uL (ref 0.0–0.1)
Basophils Relative: 1 %
Eosinophils Absolute: 0.1 10*3/uL (ref 0.0–0.5)
Eosinophils Relative: 2 %
HCT: 42.8 % (ref 39.0–52.0)
Hemoglobin: 13.2 g/dL (ref 13.0–17.0)
Immature Granulocytes: 3 %
Lymphocytes Relative: 13 %
Lymphs Abs: 0.9 10*3/uL (ref 0.7–4.0)
MCH: 29.8 pg (ref 26.0–34.0)
MCHC: 30.8 g/dL (ref 30.0–36.0)
MCV: 96.6 fL (ref 80.0–100.0)
Monocytes Absolute: 0.3 10*3/uL (ref 0.1–1.0)
Monocytes Relative: 4 %
Neutro Abs: 5 10*3/uL (ref 1.7–7.7)
Neutrophils Relative %: 77 %
Platelets: 245 10*3/uL (ref 150–400)
RBC: 4.43 MIL/uL (ref 4.22–5.81)
RDW: 13.7 % (ref 11.5–15.5)
WBC: 6.5 10*3/uL (ref 4.0–10.5)
nRBC: 0 % (ref 0.0–0.2)

## 2019-02-22 LAB — GLUCOSE, CAPILLARY
Glucose-Capillary: 184 mg/dL — ABNORMAL HIGH (ref 70–99)
Glucose-Capillary: 157 mg/dL — ABNORMAL HIGH (ref 70–99)
Glucose-Capillary: 228 mg/dL — ABNORMAL HIGH (ref 70–99)
Glucose-Capillary: 244 mg/dL — ABNORMAL HIGH (ref 70–99)
Glucose-Capillary: 218 mg/dL — ABNORMAL HIGH (ref 70–99)

## 2019-02-22 LAB — PROCALCITONIN: Procalcitonin: 0.32 ng/mL

## 2019-02-22 LAB — TRIGLYCERIDES: Triglycerides: 402 mg/dL — ABNORMAL HIGH (ref <150)

## 2019-02-22 MED ORDER — ENOXAPARIN SODIUM 60 MG/0.6ML ~~LOC~~ SOLN
0.5000 mg/kg | Freq: Two times a day (BID) | SUBCUTANEOUS | Status: DC
  Administered 2019-02-22 – 2019-02-25 (×6): 55 mg via SUBCUTANEOUS
  Filled 2019-02-22 (×6): qty 0.6

## 2019-02-22 MED ORDER — SENNOSIDES 8.8 MG/5ML PO SYRP
5.0000 mL | ORAL_SOLUTION | Freq: Two times a day (BID) | ORAL | Status: DC
  Administered 2019-02-22 – 2019-02-24 (×4): 5 mL
  Filled 2019-02-22 (×5): qty 5

## 2019-02-22 MED ORDER — SODIUM CHLORIDE 0.9 % IV SOLN
1.0000 g | INTRAVENOUS | Status: DC
  Administered 2019-02-22: 1 g via INTRAVENOUS
  Filled 2019-02-22 (×2): qty 10

## 2019-02-22 MED ORDER — POTASSIUM CHLORIDE 20 MEQ/15ML (10%) PO SOLN: 40.0000 meq | Freq: Two times a day (BID) | ORAL | Status: DC

## 2019-02-22 MED ORDER — POTASSIUM CHLORIDE 20 MEQ/15ML (10%) PO SOLN
40.0000 meq | Freq: Two times a day (BID) | ORAL | Status: AC
  Administered 2019-02-22 (×2): 40 meq
  Filled 2019-02-22 (×2): qty 30

## 2019-02-22 NOTE — Progress Notes (Signed)
Called and updated son. Told him things are steady, not much better not much worse.  Erskine Emery MD PCCM

## 2019-02-22 NOTE — Progress Notes (Signed)
RT assisted with turning pt's head to the left.  ETT secured, no complications.

## 2019-02-22 NOTE — Progress Notes (Signed)
RT assisted with turning patient to prone position.  Foam padding placed on checks, upper lip and forehead.  ETT secured with cloth tape @ 24 cm center of lip.  RT x1 and RN x 3.  Oral care done.  Prone pillow under patients head. VS stable throughout.

## 2019-02-22 NOTE — Progress Notes (Addendum)
NAME:  Walter Oliver, MRN:  [id number], DOB:  18-Mar-1970, LOS: 9 ADMISSION DATE:  2019-03-13, CONSULTATION DATE:  12/11 REFERRING MD:  Bonner Puna CHIEF COMPLAINT:  dyspnea   Brief History   48 year old male admitted on 12/8 to [facility], then moved to [facility] 03-13-19 for severe acute respiratory failure with hypoxemia secondary to COVID-19 pneumonia.   Past Medical History  None listed  Significant Hospital Events   12/8 admission 12/11 move to [facility]; completed CCP 12/11 12/13 intubated 12/14: Difficulty with ventilator synchrony, added propofol, continued benzodiazepine and narcotic infusions.  Placed in prone position. Actemra administered  Consults:  PCCM  Procedures:  Intubation 12/13 > PICC 12/15 >  Significant Diagnostic Tests:  12/13 CTA head > Negative CTA of the head and neck. No large vessel occlusion, hemodynamically significant stenosis, or other acute vascular abnormality. 12/13 CTA chest > no definite PE was identified, but study was limited by poor contrast timing. Diffuse bilateral ground-glass airspace opacities involving all lobes.  Micro Data:  12/5 SARS COV 2 > positive 12/13 blood > NG  Antimicrobials/COVID Rx  12/7 decadron x1 12/8 methylpred  12/7 remdesivir >  Completed 10 days  Interim history/subjective:  Proned.  Doing better with ketamine infusion.  Objective   Blood pressure 102/71, pulse 93, temperature 99.7 F (37.6 C), temperature source Oral, resp. rate (!) 22, height 6' (1.829 m), weight 108.9 kg, SpO2 92 %.    Vent Mode: PRVC FiO2 (%):  [60 %-80 %] 80 % Set Rate:  [22 bmp] 22 bmp Vt Set:  [450 mL-460 mL] 450 mL PEEP:  [16 cmH20] 16 cmH20 Plateau Pressure:  [21 cmH20-30 cmH20] 30 cmH20   Intake/Output Summary (Last 24 hours) at 02/22/2019 1030 Last data filed at 02/22/2019 0900 Gross per 24 hour  Intake 1735.15 ml  Output 2555 ml  Net -819.85 ml   Filed Weights   02/20/19 0436 02/21/19 0500 02/22/19 0500  Weight: 115.1 kg  113.1 kg 108.9 kg    Examination:  GEN: overweight man proned in bed HEENT: ETT in place with foul smelling secretions per RT CV: Proned, ext warm PULM: Diminished but no wheezing or rhonci GI: Proned EXT: Trace edema NEURO: Withdraws to pain, heavily sedated PSYCH: RASS -3 SKIN: No rashes  Potassium repleted Hypernatremia improved Alkalosis improved Mild transaminitis stable TG up, off propofol CBGs okay  Resolved Hospital Problem list     Assessment & Plan:   # Acute hypoxic respiratory failure with ARDS due to COVID-19 pneumonia - ARDSnet PEEP/FiO2, attempt to limit driving pressures as allowed by lung compliance - VAP prevention bundle -18/6h proning with A/a gradients < 150, adjust based on clinical response -Sedation to minimize shearing forces created by ventilator assynchrony - Keep even as able with balanced diuresis as tolerated by renal function 12/19: try to wean versed, can increase ketamine, continue opiate drip, already on PO clonazepam and oxycodone  # Hyperglycemia: improved with current regimen - continue levemir, SSI, watch closely as comes off steroids  # Elevated LFTs- likely related to covid infection +/- remdesivir, mild, trend  # Induced hypernatremia, contraction alkalosis- better today - Continue free water - Hold on diuretics today - Monitor BMET and UoP, -1.3L yesterday  # Foul smelling ETT output- tracheal aspirate pending, check Pct, low threshold to cover  Best practice:  DVT prophylaxis: Lovenox 0.5 mg/kg every 12, d dimer looks okay CODE STATUS full Diet: Start tube feeds Family: will call Sedation:  See above PUD prophylaxis: Pepcid  The patient is critically ill with multiple organ systems failure and requires high complexity decision making for assessment and support, frequent evaluation and titration of therapies, application of advanced monitoring technologies and extensive interpretation of multiple databases. Critical  Care Time devoted to patient care services described in this note independent of APP/resident time (if applicable)  is 31 minutes.   Myrla Halsted MD [facility] 02/22/2019 10:30 AM Personal pager: [phone] If unanswered, please page CCM On-call: #[phone]

## 2019-02-22 NOTE — Progress Notes (Signed)
Pt supined at this time w/o event.  RT will replace tape when pt reprones or when ABG results.

## 2019-02-22 NOTE — Progress Notes (Signed)
Pt head and arms repostioned. ETT remains secure. RT will continue to monitor.

## 2019-02-23 LAB — POCT I-STAT 7, (LYTES, BLD GAS, ICA,H+H)
Acid-Base Excess: 8 mmol/L — ABNORMAL HIGH (ref 0.0–2.0)
Bicarbonate: 34.4 mmol/L — ABNORMAL HIGH (ref 20.0–28.0)
Calcium, Ion: 1.24 mmol/L (ref 1.15–1.40)
HCT: 40 % (ref 39.0–52.0)
Hemoglobin: 13.6 g/dL (ref 13.0–17.0)
O2 Saturation: 91 %
Patient temperature: 98.9
Potassium: 3.9 mmol/L (ref 3.5–5.1)
Sodium: 144 mmol/L (ref 135–145)
TCO2: 36 mmol/L — ABNORMAL HIGH (ref 22–32)
pCO2 arterial: 56.3 mmHg — ABNORMAL HIGH (ref 32.0–48.0)
pH, Arterial: 7.394 (ref 7.350–7.450)
pO2, Arterial: 63 mmHg — ABNORMAL LOW (ref 83.0–108.0)

## 2019-02-23 LAB — BASIC METABOLIC PANEL
Anion gap: 10 (ref 5–15)
BUN: 39 mg/dL — ABNORMAL HIGH (ref 6–20)
CO2: 30 mmol/L (ref 22–32)
Calcium: 8.2 mg/dL — ABNORMAL LOW (ref 8.9–10.3)
Chloride: 104 mmol/L (ref 98–111)
Creatinine, Ser: 0.88 mg/dL (ref 0.61–1.24)
GFR calc Af Amer: >60 mL/min (ref >60)
GFR calc non Af Amer: >60 mL/min (ref >60)
Glucose, Bld: 198 mg/dL — ABNORMAL HIGH (ref 70–99)
Potassium: 4.2 mmol/L (ref 3.5–5.1)
Sodium: 144 mmol/L (ref 135–145)

## 2019-02-23 LAB — TROPONIN I (HIGH SENSITIVITY)
Troponin I (High Sensitivity): 17 ng/L (ref <18)
Troponin I (High Sensitivity): 17 ng/L (ref <18)

## 2019-02-23 LAB — COMPREHENSIVE METABOLIC PANEL
ALT: 95 U/L — ABNORMAL HIGH (ref 0–44)
AST: 67 U/L — ABNORMAL HIGH (ref 15–41)
Albumin: 2.6 g/dL — ABNORMAL LOW (ref 3.5–5.0)
Alkaline Phosphatase: 74 U/L (ref 38–126)
Anion gap: 8 (ref 5–15)
BUN: 42 mg/dL — ABNORMAL HIGH (ref 6–20)
CO2: 34 mmol/L — ABNORMAL HIGH (ref 22–32)
Calcium: 8.9 mg/dL (ref 8.9–10.3)
Chloride: 103 mmol/L (ref 98–111)
Creatinine, Ser: 0.87 mg/dL (ref 0.61–1.24)
GFR calc Af Amer: >60 mL/min (ref >60)
GFR calc non Af Amer: >60 mL/min (ref >60)
Glucose, Bld: 173 mg/dL — ABNORMAL HIGH (ref 70–99)
Potassium: 4.7 mmol/L (ref 3.5–5.1)
Sodium: 145 mmol/L (ref 135–145)
Total Bilirubin: 0.6 mg/dL (ref 0.3–1.2)
Total Protein: 5.7 g/dL — ABNORMAL LOW (ref 6.5–8.1)

## 2019-02-23 LAB — GLUCOSE, CAPILLARY
Glucose-Capillary: 148 mg/dL — ABNORMAL HIGH (ref 70–99)
Glucose-Capillary: 156 mg/dL — ABNORMAL HIGH (ref 70–99)
Glucose-Capillary: 169 mg/dL — ABNORMAL HIGH (ref 70–99)
Glucose-Capillary: 172 mg/dL — ABNORMAL HIGH (ref 70–99)
Glucose-Capillary: 174 mg/dL — ABNORMAL HIGH (ref 70–99)
Glucose-Capillary: 196 mg/dL — ABNORMAL HIGH (ref 70–99)
Glucose-Capillary: 201 mg/dL — ABNORMAL HIGH (ref 70–99)

## 2019-02-23 LAB — CBC
HCT: 40.4 % (ref 39.0–52.0)
Hemoglobin: 12.5 g/dL — ABNORMAL LOW (ref 13.0–17.0)
MCH: 30 pg (ref 26.0–34.0)
MCHC: 30.9 g/dL (ref 30.0–36.0)
MCV: 96.9 fL (ref 80.0–100.0)
Platelets: 219 10*3/uL (ref 150–400)
RBC: 4.17 MIL/uL — ABNORMAL LOW (ref 4.22–5.81)
RDW: 13.8 % (ref 11.5–15.5)
WBC: 7.3 10*3/uL (ref 4.0–10.5)
nRBC: 0 % (ref 0.0–0.2)

## 2019-02-23 LAB — PHOSPHORUS: Phosphorus: 2.8 mg/dL (ref 2.5–4.6)

## 2019-02-23 LAB — MAGNESIUM: Magnesium: 2.1 mg/dL (ref 1.7–2.4)

## 2019-02-23 MED ORDER — VANCOMYCIN HCL 1500 MG/300ML IV SOLN
1500.0000 mg | Freq: Once | INTRAVENOUS | Status: AC
  Administered 2019-02-23: 1500 mg via INTRAVENOUS
  Filled 2019-02-23: qty 300

## 2019-02-23 MED ORDER — CHLORHEXIDINE GLUCONATE 0.12 % MT SOLN
15.0000 mL | Freq: Two times a day (BID) | OROMUCOSAL | Status: DC
  Administered 2019-02-23 – 2019-03-07 (×24): 15 mL via OROMUCOSAL
  Filled 2019-02-23 (×18): qty 15

## 2019-02-23 MED ORDER — LORAZEPAM 2 MG/ML IJ SOLN
4.0000 mg | Freq: Once | INTRAMUSCULAR | Status: AC
  Administered 2019-02-23: 4 mg via INTRAVENOUS
  Filled 2019-02-23: qty 2

## 2019-02-23 MED ORDER — SODIUM CHLORIDE 0.9 % IV SOLN
2.0000 g | INTRAVENOUS | Status: AC
  Administered 2019-02-23 – 2019-03-04 (×10): 2 g via INTRAVENOUS
  Filled 2019-02-23 (×10): qty 20

## 2019-02-23 MED ORDER — DEXTROSE 5 % IV SOLN
500.0000 mg | Freq: Once | INTRAVENOUS | Status: AC
  Administered 2019-02-23: 14:00:00 500 mg via INTRAVENOUS
  Filled 2019-02-23: qty 500

## 2019-02-23 MED ORDER — BISACODYL 10 MG RE SUPP
10.0000 mg | Freq: Once | RECTAL | Status: AC
  Administered 2019-02-23: 10 mg via RECTAL
  Filled 2019-02-23: qty 1

## 2019-02-23 MED ORDER — PRO-STAT SUGAR FREE PO LIQD
60.0000 mL | Freq: Four times a day (QID) | ORAL | Status: DC
  Administered 2019-02-23 – 2019-03-07 (×46): 60 mL
  Filled 2019-02-23 (×47): qty 60

## 2019-02-23 NOTE — Progress Notes (Signed)
Patient's head repositioned and arms rotated.  Patient tolerated well.  No skin breakdown noted at this time.    

## 2019-02-23 NOTE — Progress Notes (Signed)
Pt proned at this time w/o complication. 

## 2019-02-23 NOTE — Progress Notes (Signed)
Called to bedside to assess EKG.  Telemetry calling with concern for ST elevation.  Pt is in prone position.  Inferior lead with questionable ST elevation on bedside tele / peaked T's.  K 4.7 early this am.  Pt resting comfortably on vent, sedation only.   Plan: Assess STAT EKG, troponin, BMP, ABG  Noe Gens, MSN, NP-C  Pulmonary & Critical Care 02/23/2019, 4:04 PM   Please see [email or web address] for pager details.

## 2019-02-23 NOTE — Progress Notes (Signed)
 Nutrition Follow-up  DOCUMENTATION CODES:   Obesity unspecified  INTERVENTION:   Tube Feeding:  Vital AF 1.2 at 40 ml/hr Pro-Stat 60 mL TID (6 packets/day) Provides 192 g of protein, 1952 kcals, 778 mL of free water Meets 100% of protein needs, 100% calorie needs   Add MVI with Minerals daily  NUTRITION DIAGNOSIS:   Inadequate oral intake related to acute illness as evidenced by NPO status.  Being addressed via TF   GOAL:   Patient will meet greater than or equal to 90% of their needs  Progressing  MONITOR:   Labs, Weight trends, Skin, PO intake, Vent status, TF tolerance  REASON FOR ASSESSMENT:   Consult, Ventilator Enteral/tube feeding initiation and management  ASSESSMENT:   48 yo male admitted with acute respiratory failure due to COVID-19 pneumonia ultimately requiring intubation, AKI. PMH includes DM, anxiety, HTN  RD working remotely  12/11 Admit 12/13 Intubated  12/16 Cortrak placed  Pt remains proned Patient is currently intubated on ventilator support, on versed, hydromorphone and ketamine MV: 10.3 L/min Temp (24hrs), Avg:99.4 F (37.4 C), Min:98.4 F (36.9 C), Max:100.3 F (37.9 C)  Propofol: NONE  Vital AF 1.2 at 40 ml/hr, Pro-Stat x 6, free water 400 mL q 4 hours  Labs: reviewed Meds: ss novolog, MVI, zinc sulfate, solumedrol, levemir   Diet Order:   Diet Order            Diet NPO time specified  Diet effective now              EDUCATION NEEDS:   Not appropriate for education at this time  Skin:  Skin Assessment: Skin Integrity Issues:(no pressure injuries noted)  Last BM:  12/18  Height:   Ht Readings from Last 1 Encounters:  02/15/19 6' (1.829 m)    Weight:   Wt Readings from Last 1 Encounters:  02/23/19 111 kg    Ideal Body Weight:  80.9 kg  BMI:  Body mass index is 33.19 kg/m.  Estimated Nutritional Needs:   Kcal:  3710-6269 kcals  Protein:  162-202 g  Fluid:  >/= 1.8 L    Roshawn Lacina MS, RDN,  LDN, CNSC [phone] Pager  [phone] Weekend/On-Call Pager

## 2019-02-23 NOTE — Progress Notes (Signed)
Pt was turned to supine at this time w/o event.  ABG will be drawn w/in 1 hour.

## 2019-02-23 NOTE — Progress Notes (Signed)
NAME:  Walter Oliver, MRN:  [id number], DOB:  [birth year], LOS: 92 ADMISSION DATE:  03/01/2019, CONSULTATION DATE:  12/11 REFERRING MD:  Bonner Puna CHIEF COMPLAINT:  dyspnea   Brief History   48 year old male admitted on 12/8 to [facility], then moved to [facility] February 13, 2019 for severe acute respiratory failure with hypoxemia secondary to COVID-19 pneumonia.   Past Medical History  None listed  Significant Hospital Events   12/8 admission 12/11 move to [facility]; completed CCP 12/11 12/13 intubated 12/14: Difficulty with ventilator synchrony, added propofol, continued benzodiazepine and narcotic infusions.  Placed in prone position. Actemra administered  Consults:  PCCM  Procedures:  Intubation 12/13 > PICC 12/15 >  Significant Diagnostic Tests:  12/13 CTA head > Negative CTA of the head and neck. No large vessel occlusion, hemodynamically significant stenosis, or other acute vascular abnormality. 12/13 CTA chest > no definite PE was identified, but study was limited by poor contrast timing. Diffuse bilateral ground-glass airspace opacities involving all lobes.  Micro Data:  12/5 SARS COV 2 > positive 12/13 blood > NG  Antimicrobials/COVID Rx  12/7 decadron x1 12/8 methylpred  12/7 remdesivir >  Completed 10 days 12/14 actemra x 1  Interim history/subjective:  Remains proned, oxygen requirement not budging much.  Objective   Blood pressure 112/79, pulse (!) 107, temperature 98.4 F (36.9 C), temperature source Axillary, resp. rate 17, height 6' (1.829 m), weight 111 kg, SpO2 91 %.    Vent Mode: PRVC FiO2 (%):  [80 %-100 %] 80 % Set Rate:  [22 bmp] 22 bmp Vt Set:  [450 mL-460 mL] 460 mL PEEP:  [16 cmH20] 16 cmH20 Plateau Pressure:  [28 cmH20-30 cmH20] 30 cmH20   Intake/Output Summary (Last 24 hours) at 02/23/2019 1016 Last data filed at 02/23/2019 0300 Gross per 24 hour  Intake 1933.65 ml  Output 1630 ml  Net 303.65 ml   Filed Weights   02/21/19 0500 02/22/19 0500  02/23/19 0500  Weight: 113.1 kg 108.9 kg 111 kg    Examination:  GEN: overweight man proned in bed HEENT: ETT in place with continued secretions CV: Proned, ext warm PULM: Diminished but no wheezing or rhonci GI: Proned EXT: Trace edema NEURO: Withdraws to pain, heavily sedated PSYCH: RASS -3 SKIN: No rashes  Mild hyponatremia stable Creatinine improved Transaminitis mild and stable Procalcitonin negative Haemophilus and staph aureus and tracheal aspirate  CBG okay Net +620, [facility] Problem list     Assessment & Plan:   # Acute hypoxic respiratory failure with ARDS due to COVID-19 pneumonia, now possible HCAP - ARDSnet PEEP/FiO2, attempt to limit driving pressures as allowed by lung compliance - VAP prevention bundle -16/8h proning with A/a gradients < 150, adjust based on clinical response -Sedation to minimize shearing forces created by ventilator assynchrony - Keep even as able with balanced diuresis as tolerated by renal function 12/21: continue sedation as ordered, wean PEEP/FiO2, vanc x 1 dose, ceftriaxone for HCAP vs. Bronchitis, f/u staph aureus sensitivities  # Hyperglycemia: improved with current regimen - continue levemir, SSI, watch closely as comes off steroids  # Elevated LFTs- likely related to covid infection +/- remdesivir, mild, trend  # Induced hypernatremia, contraction alkalosis - Continue free water - Dose of diuril - Monitor BMET and UoP, +600 yesterday, -4L for admission  # Constipation- will discuss bowel regimen with pharmacist   Best practice:  DVT prophylaxis: Lovenox 0.5 mg/kg every 12, d dimer looks okay CODE STATUS full Diet: Start tube feeds Family:  will call Sedation:  See above PUD prophylaxis: Pepcid   The patient is critically ill with multiple organ systems failure and requires high complexity decision making for assessment and support, frequent evaluation and titration of therapies, application of  advanced monitoring technologies and extensive interpretation of multiple databases. Critical Care Time devoted to patient care services described in this note independent of APP/resident time (if applicable)  is 33 minutes.   Myrla Halsted MD [facility] 02/23/2019 10:16 AM Personal pager: [phone] If unanswered, please page CCM On-call: #[phone]

## 2019-02-24 LAB — MAGNESIUM: Magnesium: 1.9 mg/dL (ref 1.7–2.4)

## 2019-02-24 LAB — COMPREHENSIVE METABOLIC PANEL
ALT: 97 U/L — ABNORMAL HIGH (ref 0–44)
AST: 62 U/L — ABNORMAL HIGH (ref 15–41)
Albumin: 2.6 g/dL — ABNORMAL LOW (ref 3.5–5.0)
Alkaline Phosphatase: 76 U/L (ref 38–126)
Anion gap: 10 (ref 5–15)
BUN: 38 mg/dL — ABNORMAL HIGH (ref 6–20)
CO2: 32 mmol/L (ref 22–32)
Calcium: 8.3 mg/dL — ABNORMAL LOW (ref 8.9–10.3)
Chloride: 103 mmol/L (ref 98–111)
Creatinine, Ser: 0.74 mg/dL (ref 0.61–1.24)
GFR calc Af Amer: >60 mL/min (ref >60)
GFR calc non Af Amer: >60 mL/min (ref >60)
Glucose, Bld: 158 mg/dL — ABNORMAL HIGH (ref 70–99)
Potassium: 3.5 mmol/L (ref 3.5–5.1)
Sodium: 145 mmol/L (ref 135–145)
Total Bilirubin: 0.6 mg/dL (ref 0.3–1.2)
Total Protein: 5.2 g/dL — ABNORMAL LOW (ref 6.5–8.1)

## 2019-02-24 LAB — CBC
HCT: 39.3 % (ref 39.0–52.0)
Hemoglobin: 12.1 g/dL — ABNORMAL LOW (ref 13.0–17.0)
MCH: 30 pg (ref 26.0–34.0)
MCHC: 30.8 g/dL (ref 30.0–36.0)
MCV: 97.3 fL (ref 80.0–100.0)
Platelets: 177 10*3/uL (ref 150–400)
RBC: 4.04 MIL/uL — ABNORMAL LOW (ref 4.22–5.81)
RDW: 14.3 % (ref 11.5–15.5)
WBC: 9.7 10*3/uL (ref 4.0–10.5)
nRBC: 0 % (ref 0.0–0.2)

## 2019-02-24 LAB — POCT I-STAT 7, (LYTES, BLD GAS, ICA,H+H)
Acid-Base Excess: 9 mmol/L — ABNORMAL HIGH (ref 0.0–2.0)
Bicarbonate: 36.1 mmol/L — ABNORMAL HIGH (ref 20.0–28.0)
Calcium, Ion: 1.24 mmol/L (ref 1.15–1.40)
HCT: 40 % (ref 39.0–52.0)
Hemoglobin: 13.6 g/dL (ref 13.0–17.0)
O2 Saturation: 90 %
Patient temperature: 98.4
Potassium: 3.8 mmol/L (ref 3.5–5.1)
Sodium: 144 mmol/L (ref 135–145)
TCO2: 38 mmol/L — ABNORMAL HIGH (ref 22–32)
pCO2 arterial: 57.7 mmHg — ABNORMAL HIGH (ref 32.0–48.0)
pH, Arterial: 7.404 (ref 7.350–7.450)
pO2, Arterial: 61 mmHg — ABNORMAL LOW (ref 83.0–108.0)

## 2019-02-24 LAB — CULTURE, RESPIRATORY W GRAM STAIN

## 2019-02-24 LAB — GLUCOSE, CAPILLARY
Glucose-Capillary: 134 mg/dL — ABNORMAL HIGH (ref 70–99)
Glucose-Capillary: 155 mg/dL — ABNORMAL HIGH (ref 70–99)
Glucose-Capillary: 237 mg/dL — ABNORMAL HIGH (ref 70–99)
Glucose-Capillary: 236 mg/dL — ABNORMAL HIGH (ref 70–99)

## 2019-02-24 LAB — PHOSPHORUS: Phosphorus: 3.2 mg/dL (ref 2.5–4.6)

## 2019-02-24 MED ORDER — FUROSEMIDE 10 MG/ML IJ SOLN
40.0000 mg | Freq: Once | INTRAMUSCULAR | Status: AC
  Administered 2019-02-24: 40 mg via INTRAVENOUS
  Filled 2019-02-24: qty 4

## 2019-02-24 MED ORDER — STERILE WATER FOR INJECTION IJ SOLN
INTRAMUSCULAR | Status: AC
  Filled 2019-02-24: qty 10

## 2019-02-24 MED ORDER — ACETAZOLAMIDE SODIUM 500 MG IJ SOLR
500.0000 mg | Freq: Once | INTRAMUSCULAR | Status: AC
  Administered 2019-02-24: 500 mg via INTRAVENOUS
  Filled 2019-02-24: qty 500

## 2019-02-24 MED ORDER — POTASSIUM CHLORIDE 20 MEQ/15ML (10%) PO SOLN
40.0000 meq | Freq: Once | ORAL | Status: AC
  Administered 2019-02-24: 13:00:00 40 meq via ORAL
  Filled 2019-02-24: qty 30

## 2019-02-24 MED ORDER — METOLAZONE 5 MG PO TABS
5.0000 mg | ORAL_TABLET | Freq: Once | ORAL | Status: AC
  Administered 2019-02-24: 5 mg
  Filled 2019-02-24: qty 1

## 2019-02-24 MED ORDER — MAGNESIUM SULFATE 2 GM/50ML IV SOLN
2.0000 g | Freq: Once | INTRAVENOUS | Status: AC
  Administered 2019-02-24: 2 g via INTRAVENOUS
  Filled 2019-02-24: qty 50

## 2019-02-24 MED ORDER — DEXTROSE 5 % IV SOLN: 500.0000 mg | Freq: Once | INTRAVENOUS | Status: DC

## 2019-02-24 NOTE — Progress Notes (Signed)
Called and updated son.  Erskine Emery MD

## 2019-02-24 NOTE — Progress Notes (Signed)
Pt proned at this time after taping ETT with cloth tape and securing to right side of lip.   No complications noted.

## 2019-02-24 NOTE — Progress Notes (Signed)
Called and updated patient's son, Jori Moll on patient condition. All questions answered and encouraged.

## 2019-02-24 NOTE — Progress Notes (Signed)
Patient's head repositioned and arms rotated.  Patient tolerated well.  No skin breakdown noted at this time.    

## 2019-02-24 NOTE — Progress Notes (Signed)
NAME:  Walter Oliver, MRN:  [id number], DOB:  09-Nov-1970, LOS: 74 ADMISSION DATE:  02-24-19, CONSULTATION DATE:  12/11 REFERRING MD:  Bonner Puna CHIEF COMPLAINT:  dyspnea   Brief History   48 year old male admitted on 12/8 to [facility], then moved to [facility] 2019/02/24 for severe acute respiratory failure with hypoxemia secondary to COVID-19 pneumonia.   Past Medical History  None listed  Significant Hospital Events   12/8 admission 12/11 move to [facility]; completed CCP 12/11 12/13 intubated 12/14: Difficulty with ventilator synchrony, added propofol, continued benzodiazepine and narcotic infusions.  Placed in prone position. Actemra administered  Consults:  PCCM  Procedures:  Intubation 12/13 > PICC 12/15 >  Significant Diagnostic Tests:  12/13 CTA head > Negative CTA of the head and neck. No large vessel occlusion, hemodynamically significant stenosis, or other acute vascular abnormality. 12/13 CTA chest > no definite PE was identified, but study was limited by poor contrast timing. Diffuse bilateral ground-glass airspace opacities involving all lobes.  Micro Data:  12/5 SARS COV 2 > positive 12/13 blood > NG 12/19 sputum MSSA  Antimicrobials/COVID Rx  12/7 decadron x1 12/8 methylpred  12/7 remdesivir >  Completed 10 days 12/14 actemra x 1  Interim history/subjective:  Remains proned, oxygen requirement not budging much.  Objective   Blood pressure 117/78, pulse 96, temperature 100 F (37.8 C), temperature source Axillary, resp. rate (!) 22, height 6' (1.829 m), weight 111 kg, SpO2 92 %.    Vent Mode: PRVC FiO2 (%):  [80 %] 80 % Set Rate:  [22 bmp] 22 bmp Vt Set:  [460 mL] 460 mL PEEP:  [16 cmH20] 16 cmH20 Plateau Pressure:  [39 cmH20] 39 cmH20   Intake/Output Summary (Last 24 hours) at 02/24/2019 7371 Last data filed at 02/24/2019 0600 Gross per 24 hour  Intake 2549.58 ml  Output 1975 ml  Net 574.58 ml   Filed Weights   02/21/19 0500 02/22/19 0500 02/23/19  0500  Weight: 113.1 kg 108.9 kg 111 kg    Examination:  GEN: overweight man proned in bed HEENT: ETT in place with continued secretions CV: Proned, ext warm PULM: Diminished but no wheezing or rhonci GI: Proned EXT: Trace edema NEURO: Withdraws to pain, heavily sedated PSYCH: RASS -4 SKIN: No rashes   BMP stable Blood sugars good +1.1 L yesterday, 2.1 L urine output, unmeasured stool output, most of his ins are from p.o.  Resolved Hospital Problem list     Assessment & Plan:   # Acute hypoxic respiratory failure with ARDS due to COVID-19 pneumonia, now MSSA tracheobronchitis - ARDSnet PEEP/FiO2, attempt to limit driving pressures as allowed by lung compliance - VAP prevention bundle -16/8h proning with A/a gradients < 150, adjust based on clinical response -Sedation to minimize shearing forces created by ventilator assynchrony - Keep even as able with balanced diuresis as tolerated by renal function 12/22: continue proning, treat MSSA, try to draw off more fluid medically  # Hyperglycemia: improved with current regimen, A1c 6.7% - continue levemir, SSI, watch closely as comes off steroids  # Elevated LFTs- likely related to covid infection +/- remdesivir, mild, improved, no need to trend any further  # Induced hypernatremia, contraction alkalosis, suspected global body volume overload - Continue free water  - Dose of diuril/diamox/lasix - Monitor BMET and UoP  # Constipation- improved  Best practice:  DVT prophylaxis: Lovenox 0.5 mg/kg every 12h; check AM D dimer, if increased from 12/19, switch to full dose CODE STATUS full Diet: TF Family:  will call Sedation:  See above PUD prophylaxis: Pepcid  The patient is critically ill with multiple organ systems failure and requires high complexity decision making for assessment and support, frequent evaluation and titration of therapies, application of advanced monitoring technologies and extensive interpretation of  multiple databases. Critical Care Time devoted to patient care services described in this note independent of APP/resident time (if applicable)  is 35 minutes.   Myrla Halsted MD [place] 02/24/2019 8:26 AM Personal pager: [phone] If unanswered, please page CCM On-call: #[phone]

## 2019-02-25 ENCOUNTER — Inpatient Hospital Stay (HOSPITAL_COMMUNITY): Payer: HRSA Program

## 2019-02-25 LAB — BASIC METABOLIC PANEL
Anion gap: 7 (ref 5–15)
Anion gap: 10 (ref 5–15)
BUN: 43 mg/dL — ABNORMAL HIGH (ref 6–20)
BUN: 40 mg/dL — ABNORMAL HIGH (ref 6–20)
CO2: 40 mmol/L — ABNORMAL HIGH (ref 22–32)
CO2: 31 mmol/L (ref 22–32)
Calcium: 9.1 mg/dL (ref 8.9–10.3)
Calcium: 8.3 mg/dL — ABNORMAL LOW (ref 8.9–10.3)
Chloride: 98 mmol/L (ref 98–111)
Chloride: 97 mmol/L — ABNORMAL LOW (ref 98–111)
Creatinine, Ser: 0.65 mg/dL (ref 0.61–1.24)
Creatinine, Ser: 0.72 mg/dL (ref 0.61–1.24)
GFR calc Af Amer: >60 mL/min (ref >60)
GFR calc Af Amer: >60 mL/min (ref >60)
GFR calc non Af Amer: >60 mL/min (ref >60)
GFR calc non Af Amer: >60 mL/min (ref >60)
Glucose, Bld: 138 mg/dL — ABNORMAL HIGH (ref 70–99)
Glucose, Bld: 147 mg/dL — ABNORMAL HIGH (ref 70–99)
Potassium: 4.2 mmol/L (ref 3.5–5.1)
Potassium: 2.6 mmol/L — CL (ref 3.5–5.1)
Sodium: 145 mmol/L (ref 135–145)
Sodium: 138 mmol/L (ref 135–145)

## 2019-02-25 LAB — POCT I-STAT 7, (LYTES, BLD GAS, ICA,H+H)
Acid-Base Excess: 8 mmol/L — ABNORMAL HIGH (ref 0.0–2.0)
Acid-Base Excess: 6 mmol/L — ABNORMAL HIGH (ref 0.0–2.0)
Bicarbonate: 33.7 mmol/L — ABNORMAL HIGH (ref 20.0–28.0)
Bicarbonate: 33.4 mmol/L — ABNORMAL HIGH (ref 20.0–28.0)
Calcium, Ion: 1.23 mmol/L (ref 1.15–1.40)
Calcium, Ion: 1.26 mmol/L (ref 1.15–1.40)
HCT: 36 % — ABNORMAL LOW (ref 39.0–52.0)
HCT: 38 % — ABNORMAL LOW (ref 39.0–52.0)
Hemoglobin: 12.2 g/dL — ABNORMAL LOW (ref 13.0–17.0)
Hemoglobin: 12.9 g/dL — ABNORMAL LOW (ref 13.0–17.0)
O2 Saturation: 94 %
O2 Saturation: 91 %
Patient temperature: 98.4
Patient temperature: 97.6
Potassium: 3.2 mmol/L — ABNORMAL LOW (ref 3.5–5.1)
Potassium: 2.6 mmol/L — CL (ref 3.5–5.1)
Sodium: 137 mmol/L (ref 135–145)
Sodium: 137 mmol/L (ref 135–145)
TCO2: 35 mmol/L — ABNORMAL HIGH (ref 22–32)
TCO2: 35 mmol/L — ABNORMAL HIGH (ref 22–32)
pCO2 arterial: 48.4 mmHg — ABNORMAL HIGH (ref 32.0–48.0)
pCO2 arterial: 61.9 mmHg — ABNORMAL HIGH (ref 32.0–48.0)
pH, Arterial: 7.449 (ref 7.350–7.450)
pH, Arterial: 7.338 — ABNORMAL LOW (ref 7.350–7.450)
pO2, Arterial: 70 mmHg — ABNORMAL LOW (ref 83.0–108.0)
pO2, Arterial: 66 mmHg — ABNORMAL LOW (ref 83.0–108.0)

## 2019-02-25 LAB — CBC
HCT: 39.9 % (ref 39.0–52.0)
Hemoglobin: 11.9 g/dL — ABNORMAL LOW (ref 13.0–17.0)
MCH: 33.1 pg (ref 26.0–34.0)
MCHC: 29.8 g/dL — ABNORMAL LOW (ref 30.0–36.0)
MCV: 110.8 fL — ABNORMAL HIGH (ref 80.0–100.0)
Platelets: 262 10*3/uL (ref 150–400)
RBC: 3.6 MIL/uL — ABNORMAL LOW (ref 4.22–5.81)
RDW: 15.3 % (ref 11.5–15.5)
WBC: 8.9 10*3/uL (ref 4.0–10.5)
nRBC: 1.1 % — ABNORMAL HIGH (ref 0.0–0.2)

## 2019-02-25 LAB — MAGNESIUM: Magnesium: 2.2 mg/dL (ref 1.7–2.4)

## 2019-02-25 LAB — GLUCOSE, CAPILLARY
Glucose-Capillary: 157 mg/dL — ABNORMAL HIGH (ref 70–99)
Glucose-Capillary: 125 mg/dL — ABNORMAL HIGH (ref 70–99)
Glucose-Capillary: 126 mg/dL — ABNORMAL HIGH (ref 70–99)
Glucose-Capillary: 127 mg/dL — ABNORMAL HIGH (ref 70–99)
Glucose-Capillary: 138 mg/dL — ABNORMAL HIGH (ref 70–99)
Glucose-Capillary: 132 mg/dL — ABNORMAL HIGH (ref 70–99)
Glucose-Capillary: 166 mg/dL — ABNORMAL HIGH (ref 70–99)

## 2019-02-25 LAB — PHOSPHORUS: Phosphorus: 3.4 mg/dL (ref 2.5–4.6)

## 2019-02-25 LAB — D-DIMER, QUANTITATIVE: D-Dimer, Quant: 5.09 ug/mL-FEU — ABNORMAL HIGH (ref 0.00–0.50)

## 2019-02-25 MED ORDER — ENOXAPARIN SODIUM 60 MG/0.6ML ~~LOC~~ SOLN
55.0000 mg | Freq: Once | SUBCUTANEOUS | Status: AC
  Administered 2019-02-25: 55 mg via SUBCUTANEOUS
  Filled 2019-02-25: qty 0.6

## 2019-02-25 MED ORDER — STERILE WATER FOR INJECTION IJ SOLN
INTRAMUSCULAR | Status: AC
  Administered 2019-02-25: 10 mL
  Filled 2019-02-25: qty 10

## 2019-02-25 MED ORDER — POTASSIUM CHLORIDE 10 MEQ/50ML IV SOLN
10.0000 meq | INTRAVENOUS | Status: AC
  Administered 2019-02-26 (×8): 10 meq via INTRAVENOUS
  Filled 2019-02-25 (×7): qty 50

## 2019-02-25 MED ORDER — ACETAZOLAMIDE SODIUM 500 MG IJ SOLR
500.0000 mg | Freq: Two times a day (BID) | INTRAMUSCULAR | Status: AC
  Administered 2019-02-25 (×2): 500 mg via INTRAVENOUS
  Filled 2019-02-25 (×2): qty 500

## 2019-02-25 MED ORDER — ENOXAPARIN SODIUM 120 MG/0.8ML ~~LOC~~ SOLN
1.0000 mg/kg | Freq: Two times a day (BID) | SUBCUTANEOUS | Status: DC
  Administered 2019-02-25 – 2019-02-27 (×4): 110 mg via SUBCUTANEOUS
  Filled 2019-02-25 (×6): qty 0.8

## 2019-02-25 MED ORDER — DEXTROSE 5 % IV SOLN
500.0000 mg | Freq: Once | INTRAVENOUS | Status: AC
  Administered 2019-02-25: 500 mg via INTRAVENOUS
  Filled 2019-02-25: qty 500

## 2019-02-25 MED ORDER — POTASSIUM CHLORIDE 10 MEQ/50ML IV SOLN
10.0000 meq | INTRAVENOUS | Status: AC
  Administered 2019-02-25 (×2): 10 meq via INTRAVENOUS
  Filled 2019-02-25 (×2): qty 50

## 2019-02-25 MED ORDER — METOCLOPRAMIDE HCL 5 MG/ML IJ SOLN
10.0000 mg | Freq: Four times a day (QID) | INTRAMUSCULAR | Status: AC
  Administered 2019-02-25 – 2019-02-27 (×8): 10 mg via INTRAVENOUS
  Filled 2019-02-25 (×8): qty 2

## 2019-02-25 NOTE — Progress Notes (Signed)
RT NOTE:  RT assisted with turning head to the left. ETT secured and stable throughout.

## 2019-02-25 NOTE — Progress Notes (Signed)
Called and updated son.  Dan Estefano Victory MD 

## 2019-02-25 NOTE — Progress Notes (Addendum)
RT NOTE:  Rt assisted with turning patient to supine position. ETT taped with cloth tape and secured at 25cm. Pt had episode of bradycardia to the 40's and desaturation to the 70's once supine. Pt did stabilize HR and SpO2 after approximately 5 minutes. Pt also has skin breakdown between nostrils and upper lip.

## 2019-02-25 NOTE — Progress Notes (Signed)
RT NOTE:  ABG collected.  Fio2: 60% PaO2: 70 P/F Ratio 117

## 2019-02-25 NOTE — Progress Notes (Signed)
Question of TF coming up through nose AXR with NGT in good position Cannot check residuals with cortrak Rx reglan x 48h

## 2019-02-25 NOTE — Progress Notes (Signed)
NAME:  Walter Oliver, MRN:  [id number], DOB:  13-Oct-1970, LOS: 12 ADMISSION DATE:  03/24/2019, CONSULTATION DATE:  12/11 REFERRING MD:  Bonner Puna CHIEF COMPLAINT:  dyspnea   Brief History   48 year old male admitted on 12/8 to [facility], then moved to [facility] 03/22/2019 for severe acute respiratory failure with hypoxemia secondary to COVID-19 pneumonia.   Past Medical History  None listed  Significant Hospital Events   12/8 admission 12/11 move to [facility]; completed CCP 12/11 12/13 intubated 12/14: Difficulty with ventilator synchrony, added propofol, continued benzodiazepine and narcotic infusions.  Placed in prone position. Actemra administered 12/15 onward, proning Consults:  PCCM  Procedures:  Intubation 12/13 > PICC 12/15 >  Significant Diagnostic Tests:  12/13 CTA head > Negative CTA of the head and neck. No large vessel occlusion, hemodynamically significant stenosis, or other acute vascular abnormality. 12/13 CTA chest > no definite PE was identified, but study was limited by poor contrast timing. Diffuse bilateral ground-glass airspace opacities involving all lobes.  Micro Data:  12/5 SARS COV 2 > positive 12/13 blood > NG 12/19 sputum MSSA  Antimicrobials/COVID Rx  12/7 decadron x1 12/8 methylpred  12/7 remdesivir >  Completed 10 days 12/14 actemra x 1  12/20 ceftriaxone >> 7 days planned  Interim history/subjective:  Remains proned intermittently, oxygen requirement not budging much.  Diuresed well.  Objective   Blood pressure 107/67, pulse 74, temperature (!) 97.4 F (36.3 C), temperature source Axillary, resp. rate (!) 22, height 6' (1.829 m), weight 111 kg, SpO2 (!) 88 %. CVP:  [14 mmHg] 14 mmHg  Vent Mode: PCV FiO2 (%):  [60 %] 60 % Set Rate:  [22 bmp] 22 bmp PEEP:  [16 cmH20] 16 cmH20 Plateau Pressure:  [26 cmH20-37 cmH20] 26 cmH20   Intake/Output Summary (Last 24 hours) at 02/25/2019 0813 Last data filed at 02/25/2019 0800 Gross per 24 hour    Intake 2902.58 ml  Output 4405 ml  Net -1502.42 ml   Filed Weights   02/21/19 0500 02/22/19 0500 02/23/19 0500  Weight: 113.1 kg 108.9 kg 111 kg    Examination:  GEN: overweight man proned in bed HEENT: ETT in place with continued secretions CV: Proned, ext warm PULM: Diminished but no wheezing or rhonci GI: Proned EXT: Trace edema NEURO: Withdraws to pain, heavily sedated PSYCH: RASS -4 SKIN: No rashes   Blood glucoses look good Contraction alkalosis and hypernatremia noted Creatinine holding steady 4 L urine output yesterday, net -1.6 L CBC looks good D-dimer elevation noted  Resolved Hospital Problem list     Assessment & Plan:   # Acute hypoxic respiratory failure with ARDS due to COVID-19 pneumonia, now MSSA tracheobronchitis - ARDSnet PEEP/FiO2, attempt to limit driving pressures as allowed by lung compliance - VAP prevention bundle -16/8h proning with A/a gradients < 150, adjust based on clinical response -Sedation to minimize shearing forces created by ventilator assynchrony - Keep even as able with balanced diuresis as tolerated by renal function 12/22: continue proning, treat MSSA, see diuresis below  # Elevated LFTs- likely related to covid infection +/- remdesivir, mild, improved, no need to trend any further  # D dimer elevation- worse, given high A-a gradient and risk/benefit ratio will increase to full dose AC  # Hyperglycemia: improved with current regimen, A1c 6.7% - continue levemir, SSI, watch closely as comes off steroids  # Induced hypernatremia, contraction alkalosis, suspected global body volume overload - Continue free water  - Dose of diuril/diamox; hold on lasix today -  Monitor BMET and UoP  # Constipation- improved, appreciate pharmacist help  Best practice:  DVT prophylaxis:full dose lovenox CODE STATUS full Diet: TF Family: will call Sedation:  See above PUD prophylaxis: Pepcid  The patient is critically ill with multiple  organ systems failure and requires high complexity decision making for assessment and support, frequent evaluation and titration of therapies, application of advanced monitoring technologies and extensive interpretation of multiple databases. Critical Care Time devoted to patient care services described in this note independent of APP/resident time (if applicable)  is 32 minutes.   Myrla Halsted MD [place] [facility] 02/25/2019 8:13 AM Personal pager: [phone] If unanswered, please page CCM On-call: #[phone]

## 2019-02-25 NOTE — Progress Notes (Signed)
Notified elink spoke with Network engineer and dr, informed of patient k+ level 2.6 at noon, and looked at med hx and not replaced , per md will place order.

## 2019-02-25 NOTE — Progress Notes (Signed)
[facility] Progress Note Patient Name: Walter Oliver DOB: May 28, 1970 MRN: [id number]   Date of Service  02/25/2019  HPI/Events of Note  Repeat K+ 2.6  eICU Interventions  KCL 10 meq Q 1 hour x 8 doses        Frederik Pear 02/25/2019, 11:49 PM

## 2019-02-25 NOTE — Progress Notes (Signed)
Turned patient's head and rotated arms while prone. Tolerated well. 

## 2019-02-25 NOTE — Progress Notes (Signed)
eLink Physician-Brief Progress Note Patient Name: Walter Oliver DOB: March 26, 1970 MRN: [id number]   Date of Service  02/25/2019  HPI/Events of Note  K+ 2.6 on I-stat reported earlier this afternoon but K+ was normal at 4.2  eICU Interventions  Will treat with KCL 20 meq iv over two hours while obtaining a repeat BMP to verify results, prior to treating more aggressively.        Frederik Pear 02/25/2019, 8:39 PM

## 2019-02-26 LAB — POCT I-STAT 7, (LYTES, BLD GAS, ICA,H+H)
Acid-Base Excess: 6 mmol/L — ABNORMAL HIGH (ref 0.0–2.0)
Bicarbonate: 32.8 mmol/L — ABNORMAL HIGH (ref 20.0–28.0)
Calcium, Ion: 1.25 mmol/L (ref 1.15–1.40)
HCT: 37 % — ABNORMAL LOW (ref 39.0–52.0)
Hemoglobin: 12.6 g/dL — ABNORMAL LOW (ref 13.0–17.0)
O2 Saturation: 89 %
Patient temperature: 98.7
Potassium: 3.2 mmol/L — ABNORMAL LOW (ref 3.5–5.1)
Sodium: 135 mmol/L (ref 135–145)
TCO2: 34 mmol/L — ABNORMAL HIGH (ref 22–32)
pCO2 arterial: 53.7 mmHg — ABNORMAL HIGH (ref 32.0–48.0)
pH, Arterial: 7.394 (ref 7.350–7.450)
pO2, Arterial: 59 mmHg — ABNORMAL LOW (ref 83.0–108.0)

## 2019-02-26 LAB — BASIC METABOLIC PANEL
Anion gap: 8 (ref 5–15)
BUN: 38 mg/dL — ABNORMAL HIGH (ref 6–20)
CO2: 30 mmol/L (ref 22–32)
Calcium: 8.3 mg/dL — ABNORMAL LOW (ref 8.9–10.3)
Chloride: 98 mmol/L (ref 98–111)
Creatinine, Ser: 0.68 mg/dL (ref 0.61–1.24)
GFR calc Af Amer: >60 mL/min (ref >60)
GFR calc non Af Amer: >60 mL/min (ref >60)
Glucose, Bld: 145 mg/dL — ABNORMAL HIGH (ref 70–99)
Potassium: 2.9 mmol/L — ABNORMAL LOW (ref 3.5–5.1)
Sodium: 136 mmol/L (ref 135–145)

## 2019-02-26 LAB — CBC
HCT: 35.3 % — ABNORMAL LOW (ref 39.0–52.0)
Hemoglobin: 11.4 g/dL — ABNORMAL LOW (ref 13.0–17.0)
MCH: 30.5 pg (ref 26.0–34.0)
MCHC: 32.3 g/dL (ref 30.0–36.0)
MCV: 94.4 fL (ref 80.0–100.0)
Platelets: 147 10*3/uL — ABNORMAL LOW (ref 150–400)
RBC: 3.74 MIL/uL — ABNORMAL LOW (ref 4.22–5.81)
RDW: 14 % (ref 11.5–15.5)
WBC: 11.4 10*3/uL — ABNORMAL HIGH (ref 4.0–10.5)
nRBC: 0.2 % (ref 0.0–0.2)

## 2019-02-26 LAB — GLUCOSE, CAPILLARY
Glucose-Capillary: 128 mg/dL — ABNORMAL HIGH (ref 70–99)
Glucose-Capillary: 137 mg/dL — ABNORMAL HIGH (ref 70–99)
Glucose-Capillary: 141 mg/dL — ABNORMAL HIGH (ref 70–99)
Glucose-Capillary: 141 mg/dL — ABNORMAL HIGH (ref 70–99)
Glucose-Capillary: 156 mg/dL — ABNORMAL HIGH (ref 70–99)
Glucose-Capillary: 163 mg/dL — ABNORMAL HIGH (ref 70–99)
Glucose-Capillary: 169 mg/dL — ABNORMAL HIGH (ref 70–99)

## 2019-02-26 LAB — PHOSPHORUS: Phosphorus: 4.1 mg/dL (ref 2.5–4.6)

## 2019-02-26 LAB — MAGNESIUM: Magnesium: 1.7 mg/dL (ref 1.7–2.4)

## 2019-02-26 MED ORDER — FUROSEMIDE 10 MG/ML IJ SOLN
40.0000 mg | Freq: Four times a day (QID) | INTRAMUSCULAR | Status: AC
  Administered 2019-02-26 (×2): 40 mg via INTRAVENOUS
  Filled 2019-02-26: qty 4

## 2019-02-26 MED ORDER — POTASSIUM CHLORIDE 20 MEQ/15ML (10%) PO SOLN
40.0000 meq | Freq: Four times a day (QID) | ORAL | Status: AC
  Administered 2019-02-26 (×2): 40 meq
  Filled 2019-02-26 (×2): qty 30

## 2019-02-26 MED ORDER — POLYETHYLENE GLYCOL 3350 17 G PO PACK
17.0000 g | PACK | Freq: Every day | ORAL | Status: DC
  Administered 2019-02-27 – 2019-03-01 (×3): 17 g via ORAL
  Filled 2019-02-26 (×3): qty 1

## 2019-02-26 NOTE — Progress Notes (Signed)
Patient's head repositioned and arms rotated.  Patient tolerated well.  No skin breakdown noted at this time.    

## 2019-02-26 NOTE — Progress Notes (Signed)
Patient placed in prone position.  ETT secured with cloth tape at 25 cm at the lip.  Patient tolerated well with no complications.  

## 2019-02-26 NOTE — Progress Notes (Signed)
NAME:  Walter Oliver, MRN:  [id number], DOB:  [birth year], LOS: 13 ADMISSION DATE:  12-19-18, CONSULTATION DATE:  12/11 REFERRING MD:  Jarvis NewcomerGrunz, CHIEF COMPLAINT:  dyspnea   Brief History   48 year old male [facility], [facility] 13, 2019 for severe acute respiratory failure with hypoxemia secondary to COVID-19 pneumonia.  Past Medical History  None known  Significant Hospital Events   12/8 [facility]; completed CCP 12/11 12/13 intubated 12/14: Difficulty with ventilator synchrony, added propofol, continued benzodiazepine and narcotic infusions.  Placed in prone position. Actemra administered 12/15 onward, proning 12/24 worsening oxygenation, increasing d-dimer, starting full dose lovenox  Consults:  PCCM  Procedures:  ETT 12/13 >  PICC 12/15 >   Significant Diagnostic Tests:  12/13 CTA head > Negative CTA of the head and neck. No large vessel occlusion, hemodynamically significant stenosis, or other acute vascular abnormality. 12/13 CTA chest > no definite PE was identified, but study was limited by poor contrast timing. Diffuse bilateral ground-glass airspace opacities involving all lobes.  Micro Data:  12/5 SARS COV 2 > positive 12/13 blood > NG 12/19 sputum MSSA, haemophilus  Antimicrobials:  12/7 decadron x1 12/8 methylpred  12/7 remdesivir >  Completed 10 days 12/14 actemra x 1  12/20 ceftriaxone >> 7 days planned  Interim history/subjective:   Remains prone Some increased gastric secretions  Objective   Blood pressure 107/68, pulse 97, temperature 99.2 F (37.3 C), temperature source Oral, resp. rate (!) 22, height 6' (1.829 m), weight 111.1 kg, SpO2 96 %.    Vent Mode: PCV FiO2 (%):  [75 %-80 %] 80 % Set Rate:  [22 bmp] 22 bmp PEEP:  [18 cmH20] 18 cmH20 Plateau Pressure:  [28 cmH20-32 cmH20] 32 cmH20   Intake/Output Summary (Last 24 hours) at 02/26/2019 [id number] Last data filed at 02/26/2019 0800 Gross  per 24 hour  Intake 2605.84 ml  Output 2155 ml  Net 450.84 ml   Filed Weights   02/22/19 0500 02/23/19 0500 02/26/19 0500  Weight: 108.9 kg 111 kg 111.1 kg    Examination:  General:  In bed on vent, prone HENT: NCAT ETT in place PULM: Crackles bases B, vent supported breathing CV: prone GI: prone MSK: normal bulk and tone Neuro: sedated on vent  12/23 diffuse bilateral interstitial infiltrate left greater than right, ETT in place PICC in place, no pneumothorax  Resolved Hospital Problem list     Assessment & Plan:  ARDS due to COVID 19 pneumonia: slow progress over the last week, suspect this will take weeks to recover Continue mechanical ventilation per ARDS protocol Target TVol 6-8cc/kgIBW Target Plateau Pressure < 30cm H20 Target driving pressure less than 15 cm of water Target PaO2 55-65: titrate PEEP/FiO2 per protocol As long as PaO2 to FiO2 ratio is less than 1:150 position in prone position for 16 hours a day Check CVP daily if CVL in place Target CVP less than 4, diurese as necessary Ventilator associated pneumonia prevention protocol 12/24 plan: continue prone, consider dropping PEEP, continue PRVC, check supine ABG, furosemide today, still has dyssynchrony so continue vec prn  Need for sedation for mechanical ventilation Intermittent paralytic protocol  Elevated LFT: minimal Repeat LFT in AM  Increased gastric secretions/vomiting 12/24 Hold tube feeding overnight  Staph pneumonia (MSSA, Haemophilus) Continue ceftriaxone  D-dimer Continue full dose lovenox Check lower extremity doppler ultrasound for DVT  Hyperglycemia SSI  Hypernatremia: improved Continue free water for now, monitor sodium  Constipation, resolved  now diarrhea Decrease bowel regimen Rectal tube  Best practice:  Diet: tube feeding (on hold 12/24) Pain/Anxiety/Delirium protocol (if indicated): yes VAP protocol (if indicated): yes DVT prophylaxis: full dose lovenox GI  prophylaxis: famotidine Glucose control: ssi Mobility: bed rest Code Status: full Family Communication: I called to update his son Jori Moll on 12/24, could not reach him Disposition: remain in ICU  Labs   CBC: Recent Labs  Lab 02/20/19 0100 02/21/19 0445 02/22/19 0527 02/23/19 0445 02/24/19 0500 02/25/19 0329 02/25/19 0500 02/25/19 1209 02/26/19 0232  WBC 5.6 6.5 6.5 7.3 9.7  --  8.9  --   --   NEUTROABS 4.9 4.7 5.0  --   --   --   --   --   --   HGB 13.5 13.8 13.2 12.5* 12.1* 12.2* 11.9* 12.9* 12.6*  HCT 43.0 44.0 42.8 40.4 39.3 36.0* 39.9 38.0* 37.0*  MCV 96.6 95.9 96.6 96.9 97.3  --  110.8*  --   --   PLT 333 293 245 219 177  --  262  --   --     Basic Metabolic Panel: Recent Labs  Lab 02/23/19 0853 02/23/19 1740 02/24/19 0500 02/25/19 0500 02/25/19 1209 02/25/19 2109 02/26/19 0232 02/26/19 0425  NA 145 144 145 145 137 138 135 136  K 4.7 4.2 3.5 4.2 2.6* 2.6* 3.2* 2.9*  CL 103 104 103 98  --  97*  --  98  CO2 34* 30 32 40*  --  31  --  30  GLUCOSE 173* 198* 158* 138*  --  147*  --  145*  BUN 42* 39* 38* 43*  --  40*  --  38*  CREATININE 0.87 0.88 0.74 0.65  --  0.72  --  0.68  CALCIUM 8.9 8.2* 8.3* 9.1  --  8.3*  --  8.3*  MG 2.1  --  1.9 2.2  --   --   --  1.7  PHOS 2.8  --  3.2 3.4  --   --   --  4.1   GFR: Estimated Creatinine Clearance: 146.9 mL/min (by C-G formula based on SCr of 0.68 mg/dL). Recent Labs  Lab 02/22/19 0527 02/23/19 0445 02/24/19 0500 02/25/19 0500  PROCALCITON 0.32  --   --   --   WBC 6.5 7.3 9.7 8.9    Liver Function Tests: Recent Labs  Lab 02/20/19 0100 02/21/19 0445 02/22/19 0527 02/23/19 0853 02/24/19 0500  AST 45* 52* 50* 67* 62*  ALT 63* 77* 87* 95* 97*  ALKPHOS 63 70 71 74 76  BILITOT 0.5 0.7 0.7 0.6 0.6  PROT 6.1* 5.9* 5.7* 5.7* 5.2*  ALBUMIN 2.6* 2.6* 2.5* 2.6* 2.6*   No results for input(s): LIPASE, AMYLASE in the last 168 hours. No results for input(s): AMMONIA in the last 168 hours.  ABG    Component  Value Date/Time   PHART 7.394 02/26/2019 0232   PCO2ART 53.7 (H) 02/26/2019 0232   PO2ART 59.0 (L) 02/26/2019 0232   HCO3 32.8 (H) 02/26/2019 0232   TCO2 34 (H) 02/26/2019 0232   O2SAT 89.0 02/26/2019 0232     Coagulation Profile: No results for input(s): INR, PROTIME in the last 168 hours.  Cardiac Enzymes: No results for input(s): CKTOTAL, CKMB, CKMBINDEX, TROPONINI in the last 168 hours.  HbA1C: Hgb A1c MFr Bld  Date/Time Value Ref Range Status  03-09-19 07:10 PM 6.7 (H) 4.8 - 5.6 % Final    Comment:    (NOTE) Pre diabetes:  5.7%-6.4% Diabetes:              >6.4% Glycemic control for   <7.0% adults with diabetes     CBG: Recent Labs  Lab 02/25/19 1557 02/25/19 2037 02/26/19 0010 02/26/19 0406 02/26/19 0801  GLUCAP 132* 166* 128* 156* 137*     Critical care time: 35 minutes     Heber Knightdale, MD [place] [facility] Pager: [phone] Cell: [phone] If no response, call [phone]

## 2019-02-26 NOTE — Progress Notes (Signed)
RT NOTE:  Rt assisted with turning patient to supine position. ETT taped with cloth tape and secured at 25cm. Pt had episode of bradycardia to the 50's and desaturation to the 80's once supine. Pt did stabilize HR and SpO2 after approximately 3 minutes.

## 2019-02-26 NOTE — Progress Notes (Signed)
Pt tidal volumes very low 300's RT increased pressure control to 22 at this time. Pt tidal volumes came up to high 400's to low 500's which is pt's 6CC's. RT to continue to monitor as needed.

## 2019-02-27 ENCOUNTER — Encounter (HOSPITAL_COMMUNITY): Payer: Self-pay

## 2019-02-27 ENCOUNTER — Inpatient Hospital Stay (HOSPITAL_COMMUNITY): Payer: HRSA Program

## 2019-02-27 DIAGNOSIS — L8990 Pressure ulcer of unspecified site, unspecified stage: Secondary | ICD-10-CM | POA: Diagnosis not present

## 2019-02-27 LAB — GLUCOSE, CAPILLARY
Glucose-Capillary: 132 mg/dL — ABNORMAL HIGH (ref 70–99)
Glucose-Capillary: 134 mg/dL — ABNORMAL HIGH (ref 70–99)
Glucose-Capillary: 110 mg/dL — ABNORMAL HIGH (ref 70–99)
Glucose-Capillary: 133 mg/dL — ABNORMAL HIGH (ref 70–99)
Glucose-Capillary: 158 mg/dL — ABNORMAL HIGH (ref 70–99)

## 2019-02-27 LAB — POCT I-STAT 7, (LYTES, BLD GAS, ICA,H+H)
Acid-Base Excess: 8 mmol/L — ABNORMAL HIGH (ref 0.0–2.0)
Bicarbonate: 34.2 mmol/L — ABNORMAL HIGH (ref 20.0–28.0)
Calcium, Ion: 1.22 mmol/L (ref 1.15–1.40)
HCT: 35 % — ABNORMAL LOW (ref 39.0–52.0)
Hemoglobin: 11.9 g/dL — ABNORMAL LOW (ref 13.0–17.0)
O2 Saturation: 92 %
Patient temperature: 98.7
Potassium: 3.7 mmol/L (ref 3.5–5.1)
Sodium: 135 mmol/L (ref 135–145)
TCO2: 36 mmol/L — ABNORMAL HIGH (ref 22–32)
pCO2 arterial: 56.9 mmHg — ABNORMAL HIGH (ref 32.0–48.0)
pH, Arterial: 7.387 (ref 7.350–7.450)
pO2, Arterial: 66 mmHg — ABNORMAL LOW (ref 83.0–108.0)

## 2019-02-27 LAB — CBC WITH DIFFERENTIAL/PLATELET
Abs Immature Granulocytes: 0.39 10*3/uL — ABNORMAL HIGH (ref 0.00–0.07)
Basophils Absolute: 0.1 10*3/uL (ref 0.0–0.1)
Basophils Relative: 1 %
Eosinophils Absolute: 0.3 10*3/uL (ref 0.0–0.5)
Eosinophils Relative: 3 %
HCT: 33.7 % — ABNORMAL LOW (ref 39.0–52.0)
Hemoglobin: 10.9 g/dL — ABNORMAL LOW (ref 13.0–17.0)
Immature Granulocytes: 4 %
Lymphocytes Relative: 12 %
Lymphs Abs: 1.1 10*3/uL (ref 0.7–4.0)
MCH: 30.4 pg (ref 26.0–34.0)
MCHC: 32.3 g/dL (ref 30.0–36.0)
MCV: 93.9 fL (ref 80.0–100.0)
Monocytes Absolute: 0.3 10*3/uL (ref 0.1–1.0)
Monocytes Relative: 3 %
Neutro Abs: 7.3 10*3/uL (ref 1.7–7.7)
Neutrophils Relative %: 77 %
Platelets: 142 10*3/uL — ABNORMAL LOW (ref 150–400)
RBC: 3.59 MIL/uL — ABNORMAL LOW (ref 4.22–5.81)
RDW: 14.2 % (ref 11.5–15.5)
WBC: 9.4 10*3/uL (ref 4.0–10.5)
nRBC: 0.2 % (ref 0.0–0.2)

## 2019-02-27 LAB — BASIC METABOLIC PANEL
Anion gap: 9 (ref 5–15)
BUN: 29 mg/dL — ABNORMAL HIGH (ref 6–20)
CO2: 30 mmol/L (ref 22–32)
Calcium: 8 mg/dL — ABNORMAL LOW (ref 8.9–10.3)
Chloride: 97 mmol/L — ABNORMAL LOW (ref 98–111)
Creatinine, Ser: 0.5 mg/dL — ABNORMAL LOW (ref 0.61–1.24)
GFR calc Af Amer: >60 mL/min (ref >60)
GFR calc non Af Amer: >60 mL/min (ref >60)
Glucose, Bld: 118 mg/dL — ABNORMAL HIGH (ref 70–99)
Potassium: 3.2 mmol/L — ABNORMAL LOW (ref 3.5–5.1)
Sodium: 136 mmol/L (ref 135–145)

## 2019-02-27 LAB — HEPATIC FUNCTION PANEL
ALT: 118 U/L — ABNORMAL HIGH (ref 0–44)
AST: 52 U/L — ABNORMAL HIGH (ref 15–41)
Albumin: 2.5 g/dL — ABNORMAL LOW (ref 3.5–5.0)
Alkaline Phosphatase: 73 U/L (ref 38–126)
Bilirubin, Direct: 0.2 mg/dL (ref 0.0–0.2)
Indirect Bilirubin: 0.7 mg/dL (ref 0.3–0.9)
Total Bilirubin: 0.9 mg/dL (ref 0.3–1.2)
Total Protein: 5.4 g/dL — ABNORMAL LOW (ref 6.5–8.1)

## 2019-02-27 LAB — PHOSPHORUS: Phosphorus: 2.6 mg/dL (ref 2.5–4.6)

## 2019-02-27 LAB — MAGNESIUM: Magnesium: 1.9 mg/dL (ref 1.7–2.4)

## 2019-02-27 MED ORDER — ENOXAPARIN SODIUM 60 MG/0.6ML ~~LOC~~ SOLN
0.5000 mg/kg | Freq: Two times a day (BID) | SUBCUTANEOUS | Status: DC
  Administered 2019-02-27 – 2019-02-28 (×2): 55 mg via SUBCUTANEOUS
  Filled 2019-02-27 (×2): qty 0.6

## 2019-02-27 MED ORDER — FUROSEMIDE 10 MG/ML IJ SOLN
60.0000 mg | Freq: Four times a day (QID) | INTRAMUSCULAR | Status: AC
  Administered 2019-02-27 – 2019-02-28 (×2): 60 mg via INTRAVENOUS
  Filled 2019-02-27 (×2): qty 6

## 2019-02-27 MED ORDER — POTASSIUM CHLORIDE 20 MEQ/15ML (10%) PO SOLN
40.0000 meq | Freq: Four times a day (QID) | ORAL | Status: AC
  Administered 2019-02-27 (×2): 40 meq
  Filled 2019-02-27 (×2): qty 30

## 2019-02-27 MED ORDER — STERILE WATER FOR INJECTION IJ SOLN
INTRAMUSCULAR | Status: AC
  Filled 2019-02-27: qty 10

## 2019-02-27 NOTE — Plan of Care (Signed)
  Problem: Education: Goal: Knowledge of risk factors and measures for prevention of condition will improve Outcome: Not Progressing   Problem: Coping: Goal: Psychosocial and spiritual needs will be supported Outcome: Not Progressing   Problem: Respiratory: Goal: Will maintain a patent airway Outcome: Not Progressing Goal: Complications related to the disease process, condition or treatment will be avoided or minimized Outcome: Not Progressing   

## 2019-02-27 NOTE — Progress Notes (Addendum)
NAME:  Walter Oliver, MRN:  [id number], DOB:  06-06-1970, LOS: 14 ADMISSION DATE:  2019-03-03, CONSULTATION DATE:  12/11 REFERRING MD:  Jarvis Newcomer, CHIEF COMPLAINT:  dyspnea   Brief History   48 year old male admitted on 12/8 to [facility], then moved to [facility] 03/03/2019 for severe acute respiratory failure with hypoxemia secondary to COVID-19 pneumonia.  Past Medical History  None known  Significant Hospital Events   12/8 admission 12/11 move to [facility]; completed CCP 12/11 12/13 intubated 12/14: Difficulty with ventilator synchrony, added propofol, continued benzodiazepine and narcotic infusions.  Placed in prone position. Actemra administered 12/15 onward, proning 12/24 worsening oxygenation, increasing d-dimer, starting full dose lovenox  Consults:  PCCM  Procedures:  ETT 12/13 >  PICC 12/15 >   Significant Diagnostic Tests:  12/13 CTA head > Negative CTA of the head and neck. No large vessel occlusion, hemodynamically significant stenosis, or other acute vascular abnormality. 12/13 CTA chest > no definite PE was identified, but study was limited by poor contrast timing. Diffuse bilateral ground-glass airspace opacities involving all lobes.  Micro Data:  12/5 SARS COV 2 > positive 12/13 blood > NG 12/19 sputum MSSA, haemophilus  Antimicrobials:  12/7 decadron x1 12/8 methylpred  12/7 remdesivir >  Completed 10 days 12/14 actemra x 1  12/20 ceftriaxone >> 7 days planned  Interim history/subjective:   Remained prone until 1 AM   Objective   Blood pressure 115/67, pulse 94, temperature 99 F (37.2 C), temperature source Oral, resp. rate (!) 27, height 6' (1.829 m), weight 111.2 kg, SpO2 97 %.    Vent Mode: PCV FiO2 (%):  [80 %-100 %] 100 % Set Rate:  [22 bmp] 22 bmp PEEP:  [16 cmH20-18 cmH20] 16 cmH20 Plateau Pressure:  [30 cmH20-33 cmH20] 30 cmH20   Intake/Output Summary (Last 24 hours) at 02/27/2019 0746 Last data filed at 02/27/2019 0700 Gross per 24 hour    Intake 5555.5 ml  Output 2845 ml  Net 2710.5 ml   Filed Weights   02/23/19 0500 02/26/19 0500 02/27/19 0342  Weight: 111 kg 111.1 kg 111.2 kg    Examination:  General:  In bed on vent HENT: NCAT ETT in place PULM: CTA B, vent supported breathing CV: RRR, no mgr GI: BS+, soft, nontender MSK: normal bulk and tone Neuro: sedated on vent    12/23 diffuse bilateral interstitial infiltrate left greater than right, ETT in place PICC in place, no pneumothorax  Resolved Hospital Problem list     Assessment & Plan:  ARDS due to COVID 19 pneumonia: slow progress over the last week, suspect this will take weeks to recover Continue mechanical ventilation per ARDS protocol Target TVol 6-8cc/kgIBW Target Plateau Pressure < 30cm H20 Target driving pressure less than 15 cm of water Target PaO2 55-65: titrate PEEP/FiO2 per protocol As long as PaO2 to FiO2 ratio is less than 1:150 position in prone position for 16 hours a day Check CVP daily if CVL in place Target CVP less than 4, diurese as necessary Ventilator associated pneumonia prevention protocol 12/25 prone, drop PEEP, continue PRVC, intermittent vec, increase lasix dosing as net positive  Need for sedation for mechanical ventilation: still has high sedation, synchrony needs Intermittent paralytic protocol Try to back off on sedation over next few days, starting with ketamine  Elevated LFT: minimal Repeat LFT in AM  Increased gastric secretions/vomiting 12/24> improved Restart tube feeding  Staph pneumonia (MSSA, Haemophilus) Continue ceftriaxone  Hypokalemia: replete  D-dimer Change back to 0.5mg /kg F/u lower  ext doppler  Hyperglycemia SSI  Hypernatremia: improved Restart free water today, monitor sodium  Constipation, resolved now diarrhea Rectal tube   Best practice:  Diet: tube feeding (on hold 12/24) Pain/Anxiety/Delirium protocol (if indicated): yes VAP protocol (if indicated): yes DVT prophylaxis:  full dose lovenox GI prophylaxis: famotidine Glucose control: ssi Mobility: bed rest Code Status: full Family Communication: I updated his son Windy FastRonald on 12/25, answered his questions Disposition: remain in ICU  Labs   CBC: Recent Labs  Lab 02/21/19 0445 02/22/19 0527 02/23/19 0445 02/24/19 0500 02/25/19 0500 02/25/19 1209 02/26/19 0232 02/26/19 0425 02/27/19 0213 02/27/19 0427  WBC 6.5 6.5 7.3 9.7 8.9  --   --  11.4*  --  9.4  NEUTROABS 4.7 5.0  --   --   --   --   --   --   --  7.3  HGB 13.8 13.2 12.5* 12.1* 11.9* 12.9* 12.6* 11.4* 11.9* 10.9*  HCT 44.0 42.8 40.4 39.3 39.9 38.0* 37.0* 35.3* 35.0* 33.7*  MCV 95.9 96.6 96.9 97.3 110.8*  --   --  94.4  --  93.9  PLT 293 245 219 177 262  --   --  147*  --  142*    Basic Metabolic Panel: Recent Labs  Lab 02/23/19 0853 02/24/19 0500 02/25/19 0500 02/25/19 2109 02/26/19 0232 02/26/19 0425 02/27/19 0213 02/27/19 0427  NA 145 145 145 138 135 136 135 136  K 4.7 3.5 4.2 2.6* 3.2* 2.9* 3.7 3.2*  CL 103 103 98 97*  --  98  --  97*  CO2 34* 32 40* 31  --  30  --  30  GLUCOSE 173* 158* 138* 147*  --  145*  --  118*  BUN 42* 38* 43* 40*  --  38*  --  29*  CREATININE 0.87 0.74 0.65 0.72  --  0.68  --  0.50*  CALCIUM 8.9 8.3* 9.1 8.3*  --  8.3*  --  8.0*  MG 2.1 1.9 2.2  --   --  1.7  --  1.9  PHOS 2.8 3.2 3.4  --   --  4.1  --  2.6   GFR: Estimated Creatinine Clearance: 146.9 mL/min (A) (by C-G formula based on SCr of 0.5 mg/dL (L)). Recent Labs  Lab 02/22/19 0527 02/24/19 0500 02/25/19 0500 02/26/19 0425 02/27/19 0427  PROCALCITON 0.32  --   --   --   --   WBC 6.5 9.7 8.9 11.4* 9.4    Liver Function Tests: Recent Labs  Lab 02/21/19 0445 02/22/19 0527 02/23/19 0853 02/24/19 0500 02/27/19 0427  AST 52* 50* 67* 62* 52*  ALT 77* 87* 95* 97* 118*  ALKPHOS 70 71 74 76 73  BILITOT 0.7 0.7 0.6 0.6 0.9  PROT 5.9* 5.7* 5.7* 5.2* 5.4*  ALBUMIN 2.6* 2.5* 2.6* 2.6* 2.5*   No results for input(s): LIPASE, AMYLASE in  the last 168 hours. No results for input(s): AMMONIA in the last 168 hours.  ABG    Component Value Date/Time   PHART 7.387 02/27/2019 0213   PCO2ART 56.9 (H) 02/27/2019 0213   PO2ART 66.0 (L) 02/27/2019 0213   HCO3 34.2 (H) 02/27/2019 0213   TCO2 36 (H) 02/27/2019 0213   O2SAT 92.0 02/27/2019 0213     Coagulation Profile: No results for input(s): INR, PROTIME in the last 168 hours.  Cardiac Enzymes: No results for input(s): CKTOTAL, CKMB, CKMBINDEX, TROPONINI in the last 168 hours.  HbA1C: Hgb A1c MFr Bld  Date/Time Value Ref Range Status  03/05/19 07:10 PM 6.7 (H) 4.8 - 5.6 % Final    Comment:    (NOTE) Pre diabetes:          5.7%-6.4% Diabetes:              >6.4% Glycemic control for   <7.0% adults with diabetes     CBG: Recent Labs  Lab 02/26/19 1145 02/26/19 1527 02/26/19 1955 02/26/19 2332 02/27/19 0357  GLUCAP 141* 163* 141* 169* 132*     Critical care time: 36 minutes     Roselie Awkward, MD [facility] Pager: [phone] Cell: [phone] If no response, call [phone]

## 2019-02-27 NOTE — Progress Notes (Signed)
RT NOTE:  Rt assisted with turning patient to supine position. ETT taped with cloth tape and secured at 25cm. Pt had episode of bradycardia to the 50's and desaturation to the 70s once supine. Pt did stabilize HR and SpO2 after approximately 3 minutes.

## 2019-02-27 NOTE — Progress Notes (Signed)
RT NOTE:  RT assisted RN x2 with turning patients head to the right. ETT secured at 25cm and stable throughout turn. Pt did have 1 minute episode of bradycardia to 42.

## 2019-02-27 NOTE — Progress Notes (Signed)
Patient placed in prone position by RT x 2, MD x 1, RN x 3 without complications.  Patient's head turned to the right.

## 2019-02-28 ENCOUNTER — Inpatient Hospital Stay (HOSPITAL_COMMUNITY): Payer: HRSA Program

## 2019-02-28 LAB — CBC WITH DIFFERENTIAL/PLATELET
Abs Immature Granulocytes: 0.2 10*3/uL — ABNORMAL HIGH (ref 0.00–0.07)
Basophils Absolute: 0 10*3/uL (ref 0.0–0.1)
Basophils Relative: 0 %
Eosinophils Absolute: 0.3 10*3/uL (ref 0.0–0.5)
Eosinophils Relative: 4 %
HCT: 36.9 % — ABNORMAL LOW (ref 39.0–52.0)
Hemoglobin: 11.9 g/dL — ABNORMAL LOW (ref 13.0–17.0)
Immature Granulocytes: 2 %
Lymphocytes Relative: 10 %
Lymphs Abs: 0.9 10*3/uL (ref 0.7–4.0)
MCH: 30.4 pg (ref 26.0–34.0)
MCHC: 32.2 g/dL (ref 30.0–36.0)
MCV: 94.4 fL (ref 80.0–100.0)
Monocytes Absolute: 0.3 10*3/uL (ref 0.1–1.0)
Monocytes Relative: 3 %
Neutro Abs: 6.8 10*3/uL (ref 1.7–7.7)
Neutrophils Relative %: 81 %
Platelets: 150 10*3/uL (ref 150–400)
RBC: 3.91 MIL/uL — ABNORMAL LOW (ref 4.22–5.81)
RDW: 14.8 % (ref 11.5–15.5)
WBC: 8.4 10*3/uL (ref 4.0–10.5)
nRBC: 0 % (ref 0.0–0.2)

## 2019-02-28 LAB — POCT I-STAT 7, (LYTES, BLD GAS, ICA,H+H)
Acid-Base Excess: 10 mmol/L — ABNORMAL HIGH (ref 0.0–2.0)
Bicarbonate: 37.7 mmol/L — ABNORMAL HIGH (ref 20.0–28.0)
Calcium, Ion: 1.23 mmol/L (ref 1.15–1.40)
HCT: 35 % — ABNORMAL LOW (ref 39.0–52.0)
Hemoglobin: 11.9 g/dL — ABNORMAL LOW (ref 13.0–17.0)
O2 Saturation: 97 %
Patient temperature: 98.5
Potassium: 3.9 mmol/L (ref 3.5–5.1)
Sodium: 136 mmol/L (ref 135–145)
TCO2: 40 mmol/L — ABNORMAL HIGH (ref 22–32)
pCO2 arterial: 65.6 mmHg (ref 32.0–48.0)
pH, Arterial: 7.367 (ref 7.350–7.450)
pO2, Arterial: 94 mmHg (ref 83.0–108.0)

## 2019-02-28 LAB — GLUCOSE, CAPILLARY
Glucose-Capillary: 149 mg/dL — ABNORMAL HIGH (ref 70–99)
Glucose-Capillary: 150 mg/dL — ABNORMAL HIGH (ref 70–99)
Glucose-Capillary: 151 mg/dL — ABNORMAL HIGH (ref 70–99)
Glucose-Capillary: 174 mg/dL — ABNORMAL HIGH (ref 70–99)
Glucose-Capillary: 230 mg/dL — ABNORMAL HIGH (ref 70–99)
Glucose-Capillary: 296 mg/dL — ABNORMAL HIGH (ref 70–99)
Glucose-Capillary: 327 mg/dL — ABNORMAL HIGH (ref 70–99)

## 2019-02-28 LAB — COMPREHENSIVE METABOLIC PANEL
ALT: 106 U/L — ABNORMAL HIGH (ref 0–44)
AST: 44 U/L — ABNORMAL HIGH (ref 15–41)
Albumin: 2.7 g/dL — ABNORMAL LOW (ref 3.5–5.0)
Alkaline Phosphatase: 91 U/L (ref 38–126)
Anion gap: 12 (ref 5–15)
BUN: 32 mg/dL — ABNORMAL HIGH (ref 6–20)
CO2: 32 mmol/L (ref 22–32)
Calcium: 8.8 mg/dL — ABNORMAL LOW (ref 8.9–10.3)
Chloride: 93 mmol/L — ABNORMAL LOW (ref 98–111)
Creatinine, Ser: 0.57 mg/dL — ABNORMAL LOW (ref 0.61–1.24)
GFR calc Af Amer: >60 mL/min (ref >60)
GFR calc non Af Amer: >60 mL/min (ref >60)
Glucose, Bld: 167 mg/dL — ABNORMAL HIGH (ref 70–99)
Potassium: 3.6 mmol/L (ref 3.5–5.1)
Sodium: 137 mmol/L (ref 135–145)
Total Bilirubin: 0.5 mg/dL (ref 0.3–1.2)
Total Protein: 6.2 g/dL — ABNORMAL LOW (ref 6.5–8.1)

## 2019-02-28 MED ORDER — FUROSEMIDE 10 MG/ML IJ SOLN
60.0000 mg | Freq: Three times a day (TID) | INTRAMUSCULAR | Status: AC
  Administered 2019-02-28 – 2019-03-01 (×3): 60 mg via INTRAVENOUS
  Filled 2019-02-28 (×3): qty 6

## 2019-02-28 MED ORDER — VECURONIUM BROMIDE 10 MG IV SOLR
0.0000 ug/kg/min | INTRAVENOUS | Status: DC
  Filled 2019-02-28: qty 100

## 2019-02-28 MED ORDER — ENOXAPARIN SODIUM 120 MG/0.8ML ~~LOC~~ SOLN
1.0000 mg/kg | Freq: Two times a day (BID) | SUBCUTANEOUS | Status: DC
  Administered 2019-02-28 – 2019-03-01 (×2): 110 mg via SUBCUTANEOUS
  Filled 2019-02-28 (×2): qty 0.8

## 2019-02-28 MED ORDER — HYDRALAZINE HCL 20 MG/ML IJ SOLN
10.0000 mg | Freq: Four times a day (QID) | INTRAMUSCULAR | Status: DC | PRN
  Administered 2019-02-28 – 2019-03-04 (×3): 10 mg via INTRAVENOUS
  Filled 2019-02-28 (×3): qty 1

## 2019-02-28 MED ORDER — MIDAZOLAM PEDS BOLUS VIA INFUSION
1.0000 mg | INTRAVENOUS | Status: DC | PRN
  Administered 2019-03-02 – 2019-03-05 (×4): 2 mg via INTRAVENOUS
  Filled 2019-02-28: qty 2

## 2019-02-28 MED ORDER — SODIUM CHLORIDE 0.9 % IV SOLN
0.0000 ug/kg/min | INTRAVENOUS | Status: DC
  Administered 2019-02-28: 3 ug/kg/min via INTRAVENOUS
  Administered 2019-02-28 – 2019-03-02 (×6): 4 ug/kg/min via INTRAVENOUS
  Administered 2019-03-02: 5 ug/kg/min via INTRAVENOUS
  Administered 2019-03-03 (×5): 6 ug/kg/min via INTRAVENOUS
  Administered 2019-03-04: 07:00:00 5 ug/kg/min via INTRAVENOUS
  Filled 2019-02-28 (×24): qty 20

## 2019-02-28 MED ORDER — MIDAZOLAM HCL 2 MG/2ML IJ SOLN
4.0000 mg | Freq: Once | INTRAMUSCULAR | Status: AC
  Administered 2019-02-28: 18:00:00 4 mg via INTRAVENOUS
  Filled 2019-02-28: qty 4

## 2019-02-28 MED ORDER — FUROSEMIDE 10 MG/ML IJ SOLN: 60.0000 mg | Freq: Three times a day (TID) | INTRAMUSCULAR | Status: DC

## 2019-02-28 MED ORDER — LABETALOL HCL 5 MG/ML IV SOLN
20.0000 mg | INTRAVENOUS | Status: DC | PRN
  Administered 2019-02-28 – 2019-03-04 (×5): 20 mg via INTRAVENOUS
  Filled 2019-02-28 (×5): qty 4

## 2019-02-28 NOTE — Progress Notes (Signed)
Patient placed in prone position by RT x 3 and RN x 3 without complications.  ETT secured with cloth tape.  Skin abrasions on both cheeks and at the base of the nose. Skin protection barriers placed on all sites and RN notified.

## 2019-02-28 NOTE — Progress Notes (Signed)
NAME:  Walter Oliver, MRN:  [id number], DOB:  [birth year], LOS: 15 ADMISSION DATE:  02/10/2019, CONSULTATION DATE:  12/11 REFERRING MD:  Jarvis Newcomer, CHIEF COMPLAINT:  dyspnea   Brief History   48 year old male admitted on 12/8 to [facility], then moved to [facility] February 13, 2019 for severe acute respiratory failure with hypoxemia secondary to COVID-19 pneumonia.  Past Medical History  None known  Significant Hospital Events   12/8 admission 12/11 move to [facility]; completed CCP 12/11 12/13 intubated 12/14: Difficulty with ventilator synchrony, added propofol, continued benzodiazepine and narcotic infusions.  Placed in prone position. Actemra administered 12/15 onward, proning 12/23 worsening oxygenation, increasing d-dimer, starting full dose lovenox 12/26 worsening oxygenation again  Consults:  PCCM  Procedures:  ETT 12/13 >  PICC 12/15 >   Significant Diagnostic Tests:  12/13 CTA head > Negative CTA of the head and neck. No large vessel occlusion, hemodynamically significant stenosis, or other acute vascular abnormality. 12/13 CTA chest > no definite PE was identified, but study was limited by poor contrast timing. Diffuse bilateral ground-glass airspace opacities involving all lobes.  Micro Data:  12/5 SARS COV 2 > positive 12/13 blood > NG 12/19 sputum MSSA, haemophilus  Antimicrobials:  12/7 decadron x1 12/8 methylpred  12/7 remdesivir >  Completed 10 days 12/14 actemra x 1  12/20 ceftriaxone >> 10 days planned  Interim history/subjective:   Worsening oxygenation again today   Objective   Blood pressure (!) 143/77, pulse 84, temperature 98.5 F (36.9 C), temperature source Axillary, resp. rate (!) 22, height 6' (1.829 m), weight 113.7 kg, SpO2 (!) 89 %.    Vent Mode: PCV FiO2 (%):  [80 %-100 %] 100 % Set Rate:  [22 bmp] 22 bmp PEEP:  [16 cmH20] 16 cmH20 Plateau Pressure:  [33 cmH20-38 cmH20] 38 cmH20   Intake/Output Summary (Last 24 hours) at 02/28/2019 1020 Last  data filed at 02/28/2019 0900 Gross per 24 hour  Intake 2739.4 ml  Output 2550 ml  Net 189.4 ml   Filed Weights   02/26/19 0500 02/27/19 0342 02/28/19 0500  Weight: 111.1 kg 111.2 kg 113.7 kg    Examination:  General:  In bed on vent HENT: NCAT ETT in place PULM: Rhonchi bilaterally B, vent supported breathing CV: RRR, no mgr GI: BS+, soft, nontender MSK: normal bulk and tone Neuro: sedated on vent   12/26 diffuse bilateral airspace disease personally reviewed  Resolved Hospital Problem list     Assessment & Plan:  ARDS due to COVID 19 pneumonia: worse again 12/26 Continue mechanical ventilation per ARDS protocol Target TVol 6-8cc/kgIBW Target Plateau Pressure < 30cm H20 Target driving pressure less than 15 cm of water Target PaO2 55-65: titrate PEEP/FiO2 per protocol As long as PaO2 to FiO2 ratio is less than 1:150 position in prone position for 16 hours a day Check CVP daily if CVL in place Target CVP less than 4, diurese as necessary Ventilator associated pneumonia prevention protocol 12/26 diurese, prone again, paralyze with nimbex continuous because dyssynchronous and prn doses ineffective  Need for sedation for mechanical ventilation: still has high sedation, synchrony needs Back to continuous infusion of paralytic RASS target -4 to -5 Continue three sedatives ketamine, versed, dilaudid  Hypertension Add prn hydralazine  Elevated LFT: minimal, improving Repeat LFT in AM  Increased gastric secretions/vomiting 12/24> improved Continue tube feeding  Staph pneumonia (MSSA, Haemophilus) Continue ceftriaxone, plan 10 days Repeat resp culture  Hypokalemia: replete  D-dimer Full dose lovenox Stat LE doppler  Hyperglycemia SSI  Hypernatremia: improved Continue free water  Constipation, resolved now diarrhea Rectal tube   Best practice:  Diet: tube feeding (on hold 12/24) Pain/Anxiety/Delirium protocol (if indicated): yes VAP protocol (if  indicated): yes DVT prophylaxis: full dose lovenox GI prophylaxis: famotidine Glucose control: ssi Mobility: bed rest Code Status: full Family Communication: I updated his son 12/26 Disposition: remain in ICU  Labs   CBC: Recent Labs  Lab 02/22/19 0527 02/22/19 2232 02/24/19 0500 02/25/19 0500 02/26/19 0425 02/27/19 0213 02/27/19 0427 02/28/19 0524 02/28/19 0537  WBC 6.5   < > 9.7 8.9 11.4*  --  9.4  --  8.4  NEUTROABS 5.0  --   --   --   --   --  7.3  --  6.8  HGB 13.2  --  12.1* 11.9* 11.4* 11.9* 10.9* 11.9* 11.9*  HCT 42.8  --  39.3 39.9 35.3* 35.0* 33.7* 35.0* 36.9*  MCV 96.6   < > 97.3 110.8* 94.4  --  93.9  --  94.4  PLT 245   < > 177 262 147*  --  142*  --  150   < > = values in this interval not displayed.    Basic Metabolic Panel: Recent Labs  Lab 02/23/19 0853 02/24/19 0500 02/25/19 0500 02/25/19 2109 02/26/19 0425 02/27/19 0213 02/27/19 0427 02/28/19 0524 02/28/19 0537  NA 145 145 145 138 136 135 136 136 137  K 4.7 3.5 4.2 2.6* 2.9* 3.7 3.2* 3.9 3.6  CL 103 103 98 97* 98  --  97*  --  93*  CO2 34* 32 40* 31 30  --  30  --  32  GLUCOSE 173* 158* 138* 147* 145*  --  118*  --  167*  BUN 42* 38* 43* 40* 38*  --  29*  --  32*  CREATININE 0.87 0.74 0.65 0.72 0.68  --  0.50*  --  0.57*  CALCIUM 8.9 8.3* 9.1 8.3* 8.3*  --  8.0*  --  8.8*  MG 2.1 1.9 2.2  --  1.7  --  1.9  --   --   PHOS 2.8 3.2 3.4  --  4.1  --  2.6  --   --    GFR: Estimated Creatinine Clearance: 148.5 mL/min (A) (by C-G formula based on SCr of 0.57 mg/dL (L)). Recent Labs  Lab 02/22/19 0527 02/25/19 0500 02/26/19 0425 02/27/19 0427 02/28/19 0537  PROCALCITON 0.32  --   --   --   --   WBC 6.5 8.9 11.4* 9.4 8.4    Liver Function Tests: Recent Labs  Lab 02/22/19 0527 02/23/19 0853 02/24/19 0500 02/27/19 0427 02/28/19 0537  AST 50* 67* 62* 52* 44*  ALT 87* 95* 97* 118* 106*  ALKPHOS 71 74 76 73 91  BILITOT 0.7 0.6 0.6 0.9 0.5  PROT 5.7* 5.7* 5.2* 5.4* 6.2*  ALBUMIN 2.5*  2.6* 2.6* 2.5* 2.7*   No results for input(s): LIPASE, AMYLASE in the last 168 hours. No results for input(s): AMMONIA in the last 168 hours.  ABG    Component Value Date/Time   PHART 7.367 02/28/2019 0524   PCO2ART 65.6 (HH) 02/28/2019 0524   PO2ART 94.0 02/28/2019 0524   HCO3 37.7 (H) 02/28/2019 0524   TCO2 40 (H) 02/28/2019 0524   O2SAT 97.0 02/28/2019 0524     Coagulation Profile: No results for input(s): INR, PROTIME in the last 168 hours.  Cardiac Enzymes: No results for input(s): CKTOTAL, CKMB, CKMBINDEX, TROPONINI in the last  168 hours.  HbA1C: Hgb A1c MFr Bld  Date/Time Value Ref Range Status  02/18/2019 07:10 PM 6.7 (H) 4.8 - 5.6 % Final    Comment:    (NOTE) Pre diabetes:          5.7%-6.4% Diabetes:              >6.4% Glycemic control for   <7.0% adults with diabetes     CBG: Recent Labs  Lab 02/27/19 1511 02/27/19 1952 02/28/19 0055 02/28/19 0349 02/28/19 0816  GLUCAP 133* 158* 150* 151* 149*     Critical care time: 35 minutes     Heber CarolinaBrent Lynzi Meulemans, MD [facility] Pager: [phone] Cell: [phone] If no response, call [phone]

## 2019-02-28 NOTE — Progress Notes (Signed)
VASCULAR LAB    Patient prone at 14:00. Spoke with RN and explained the Vascular Lab has attempted to scan patient 3 days in a row, but patient has been prone each time.     Martrice Apt, RVT 02/28/2019, 2:11 PM

## 2019-02-28 NOTE — Progress Notes (Addendum)
RT NOTE:  Rt assisted with turning patient to supine position. ETT secured at 25cm w/ tube holder.

## 2019-02-28 NOTE — Progress Notes (Signed)
RT NOTE:  RT assisted with turning patients head to the left . ETT stable throughout.  

## 2019-02-28 NOTE — Progress Notes (Signed)
CVP 10. MD notified. Lasix ordered.

## 2019-03-01 ENCOUNTER — Inpatient Hospital Stay (HOSPITAL_COMMUNITY): Payer: HRSA Program

## 2019-03-01 DIAGNOSIS — R7989 Other specified abnormal findings of blood chemistry: Secondary | ICD-10-CM

## 2019-03-01 DIAGNOSIS — M7989 Other specified soft tissue disorders: Secondary | ICD-10-CM

## 2019-03-01 LAB — POCT I-STAT 7, (LYTES, BLD GAS, ICA,H+H)
Acid-Base Excess: 14 mmol/L — ABNORMAL HIGH (ref 0.0–2.0)
Bicarbonate: 44.3 mmol/L — ABNORMAL HIGH (ref 20.0–28.0)
Calcium, Ion: 1.26 mmol/L (ref 1.15–1.40)
HCT: 32 % — ABNORMAL LOW (ref 39.0–52.0)
Hemoglobin: 10.9 g/dL — ABNORMAL LOW (ref 13.0–17.0)
O2 Saturation: 92 %
Patient temperature: 98.1
Potassium: 4 mmol/L (ref 3.5–5.1)
Sodium: 136 mmol/L (ref 135–145)
TCO2: 47 mmol/L — ABNORMAL HIGH (ref 22–32)
pCO2 arterial: 93.4 mmHg (ref 32.0–48.0)
pH, Arterial: 7.282 — ABNORMAL LOW (ref 7.350–7.450)
pO2, Arterial: 74 mmHg — ABNORMAL LOW (ref 83.0–108.0)

## 2019-03-01 LAB — GLUCOSE, CAPILLARY
Glucose-Capillary: 118 mg/dL — ABNORMAL HIGH (ref 70–99)
Glucose-Capillary: 131 mg/dL — ABNORMAL HIGH (ref 70–99)
Glucose-Capillary: 135 mg/dL — ABNORMAL HIGH (ref 70–99)
Glucose-Capillary: 165 mg/dL — ABNORMAL HIGH (ref 70–99)
Glucose-Capillary: 179 mg/dL — ABNORMAL HIGH (ref 70–99)
Glucose-Capillary: 191 mg/dL — ABNORMAL HIGH (ref 70–99)
Glucose-Capillary: 217 mg/dL — ABNORMAL HIGH (ref 70–99)

## 2019-03-01 LAB — CBC WITH DIFFERENTIAL/PLATELET
Abs Immature Granulocytes: 0.2 10*3/uL — ABNORMAL HIGH (ref 0.00–0.07)
Basophils Absolute: 0 10*3/uL (ref 0.0–0.1)
Basophils Relative: 0 %
Eosinophils Absolute: 0.1 10*3/uL (ref 0.0–0.5)
Eosinophils Relative: 1 %
HCT: 35.9 % — ABNORMAL LOW (ref 39.0–52.0)
Hemoglobin: 11.1 g/dL — ABNORMAL LOW (ref 13.0–17.0)
Immature Granulocytes: 2 %
Lymphocytes Relative: 9 %
Lymphs Abs: 0.9 10*3/uL (ref 0.7–4.0)
MCH: 30.7 pg (ref 26.0–34.0)
MCHC: 30.9 g/dL (ref 30.0–36.0)
MCV: 99.2 fL (ref 80.0–100.0)
Monocytes Absolute: 0.6 10*3/uL (ref 0.1–1.0)
Monocytes Relative: 6 %
Neutro Abs: 8 10*3/uL — ABNORMAL HIGH (ref 1.7–7.7)
Neutrophils Relative %: 82 %
Platelets: 156 10*3/uL (ref 150–400)
RBC: 3.62 MIL/uL — ABNORMAL LOW (ref 4.22–5.81)
RDW: 15.4 % (ref 11.5–15.5)
WBC: 9.8 10*3/uL (ref 4.0–10.5)
nRBC: 0.3 % — ABNORMAL HIGH (ref 0.0–0.2)

## 2019-03-01 LAB — BASIC METABOLIC PANEL
Anion gap: 14 (ref 5–15)
BUN: 39 mg/dL — ABNORMAL HIGH (ref 6–20)
CO2: 38 mmol/L — ABNORMAL HIGH (ref 22–32)
Calcium: 8.1 mg/dL — ABNORMAL LOW (ref 8.9–10.3)
Chloride: 87 mmol/L — ABNORMAL LOW (ref 98–111)
Creatinine, Ser: 0.58 mg/dL — ABNORMAL LOW (ref 0.61–1.24)
GFR calc Af Amer: >60 mL/min (ref >60)
GFR calc non Af Amer: >60 mL/min (ref >60)
Glucose, Bld: 154 mg/dL — ABNORMAL HIGH (ref 70–99)
Potassium: 4.1 mmol/L (ref 3.5–5.1)
Sodium: 139 mmol/L (ref 135–145)

## 2019-03-01 MED ORDER — ENOXAPARIN SODIUM 60 MG/0.6ML ~~LOC~~ SOLN
0.5000 mg/kg | Freq: Two times a day (BID) | SUBCUTANEOUS | Status: DC
  Administered 2019-03-01 – 2019-03-07 (×12): 55 mg via SUBCUTANEOUS
  Filled 2019-03-01 (×13): qty 0.6

## 2019-03-01 MED ORDER — FUROSEMIDE 10 MG/ML IJ SOLN
60.0000 mg | Freq: Three times a day (TID) | INTRAMUSCULAR | Status: AC
  Administered 2019-03-01 – 2019-03-02 (×3): 60 mg via INTRAVENOUS
  Filled 2019-03-01 (×3): qty 6

## 2019-03-01 NOTE — Progress Notes (Signed)
VASCULAR LAB PRELIMINARY  PRELIMINARY  PRELIMINARY  PRELIMINARY   Bilateral lower extremity venous duplex completed.    Preliminary report:  See CV proc for preliminary results.   Vanshika Jastrzebski, RVT 03/01/2019, 12:08 PM

## 2019-03-01 NOTE — Progress Notes (Signed)
Patient returned to supine position. Holister Etab placed. ETT secured 25cm @ lip.Tolerated well. No complications.

## 2019-03-01 NOTE — Progress Notes (Signed)
Head repositioned. Arms rotated. Tolerated well. ETT remains secure. 

## 2019-03-01 NOTE — Progress Notes (Signed)
NAME:  Walter Oliver, MRN:  [id number], DOB:  [birth year], LOS: 86 ADMISSION DATE:  03/05/2019, CONSULTATION DATE:  12/11 REFERRING MD:  Bonner Puna, CHIEF COMPLAINT:  dyspnea   Brief History   48 year old male admitted on 12/8 to [facility], then moved to [facility] 2019-03-05 for severe acute respiratory failure with hypoxemia secondary to COVID-19 pneumonia.  Past Medical History  None known  Significant Hospital Events   12/8 admission 12/11 move to [facility]; completed CCP 12/11 12/13 intubated 12/14: Difficulty with ventilator synchrony, added propofol, continued benzodiazepine and narcotic infusions.  Placed in prone position. Actemra administered 12/15 onward, proning 12/23 worsening oxygenation, increasing d-dimer, starting full dose lovenox 12/26 worsening oxygenation again  Consults:  PCCM  Procedures:  ETT 12/13 >  PICC 12/15 >   Significant Diagnostic Tests:  12/13 CTA head > Negative CTA of the head and neck. No large vessel occlusion, hemodynamically significant stenosis, or other acute vascular abnormality. 12/13 CTA chest > no definite PE was identified, but study was limited by poor contrast timing. Diffuse bilateral ground-glass airspace opacities involving all lobes. 12/27 LE doppler > neg dvt bilat (prelim)  Micro Data:  12/5 SARS COV 2 > positive 12/13 blood > NG 12/19 sputum MSSA, haemophilus  Antimicrobials:  12/7 decadron x1 12/8 methylpred  12/7 remdesivir >  Completed 10 days 12/14 actemra x 1  12/20 ceftriaxone >> 10 days planned  Interim history/subjective:   Diuresed well yesterday HR, BP improved on higher doses of midazolam Remains paralyzed Plateau pressure 12/27 is 34  Objective   Blood pressure 130/73, pulse 93, temperature 98.3 F (36.8 C), temperature source Oral, resp. rate (!) 22, height 6' (1.829 m), weight 116.5 kg, SpO2 93 %.    Vent Mode: PCV FiO2 (%):  [100 %] 100 % Set Rate:  [22 bmp] 22 bmp PEEP:  [16 cmH20] 16 cmH20 Plateau  Pressure:  [36 cmH20-38 cmH20] 36 cmH20   Intake/Output Summary (Last 24 hours) at 03/01/2019 0727 Last data filed at 03/01/2019 0700 Gross per 24 hour  Intake 4095.64 ml  Output 5520 ml  Net -1424.36 ml   Filed Weights   02/27/19 0342 02/28/19 0500 03/01/19 0226  Weight: 111.2 kg 113.7 kg 116.5 kg    Examination:  General:  In bed on vent HENT: NCAT ETT in place PULM: Crackles bases B, vent supported breathing CV: RRR, no mgr GI: BS+, soft, nontender MSK: normal bulk and tone Neuro: sedated on vent   12/27 severe bilateral airspace disease, ? Pleural effusion on left  Resolved Hospital Problem list     Assessment & Plan:  ARDS due to COVID 19 pneumonia: severe hypoxemia, stable overnight 12/27 but requiring high sedation and continuous paralytic again Continue mechanical ventilation per ARDS protocol Target TVol 6-8cc/kgIBW Target Plateau Pressure < 30cm H20 Target driving pressure less than 15 cm of water Target PaO2 55-65: titrate PEEP/FiO2 per protocol As long as PaO2 to FiO2 ratio is less than 1:150 position in prone position for 16 hours a day Check CVP daily if CVL in place Target CVP less than 4, diurese as necessary Ventilator associated pneumonia prevention protocol 12/27 continue continuous nimbex again today given severity of hypoxemia yesterday, supine abg, hold prone as no improvement in plateau or oxygenation while prone, need to consider tracheostomy this week, diurese again today  Need for sedation for mechanical ventilation: still has high sedation, synchrony needs Continuous paralytic protocol RASS -5 Ketamine, high dose versed and dilaudid Continue oxycodone and clonazepam  Hypertension Prn  hydralazine  Elevated LFT: minimal, improving Repeat LFT in AM  Increased gastric secretions/vomiting 12/24> improved Continue tube feeding  Staph pneumonia (MSSA, Haemophilus) Ceftriaxone 10 days F/u resp culture  12/26  Hypokalemia: replete  D-dimer Change lovenox to 0.5mg /kg sub cutaneous  Hyperglycemia SSI  Hypernatremia: improved Continue free water  Constipation, resolved now diarrhea Stop miralax   Best practice:  Diet: tube feeding (on hold 12/24) Pain/Anxiety/Delirium protocol (if indicated): yes VAP protocol (if indicated): yes DVT prophylaxis: full dose lovenox GI prophylaxis: famotidine Glucose control: ssi Mobility: bed rest Code Status: full Family Communication: I updated his son on 12/27, let him know we will need to consider tracheostomy this week when safe. Disposition: remain in ICU  Labs   CBC: Recent Labs  Lab 02/25/19 0500 02/26/19 0425 02/27/19 0213 02/27/19 0427 02/28/19 0524 02/28/19 0537 03/01/19 0537  WBC 8.9 11.4*  --  9.4  --  8.4 9.8  NEUTROABS  --   --   --  7.3  --  6.8 8.0*  HGB 11.9* 11.4* 11.9* 10.9* 11.9* 11.9* 11.1*  HCT 39.9 35.3* 35.0* 33.7* 35.0* 36.9* 35.9*  MCV 110.8* 94.4  --  93.9  --  94.4 99.2  PLT 262 147*  --  142*  --  150 156    Basic Metabolic Panel: Recent Labs  Lab 02/23/19 0853 02/24/19 0500 02/25/19 0500 02/25/19 2109 02/26/19 0425 02/27/19 0213 02/27/19 0427 02/28/19 0524 02/28/19 0537  NA 145 145 145 138 136 135 136 136 137  K 4.7 3.5 4.2 2.6* 2.9* 3.7 3.2* 3.9 3.6  CL 103 103 98 97* 98  --  97*  --  93*  CO2 34* 32 40* 31 30  --  30  --  32  GLUCOSE 173* 158* 138* 147* 145*  --  118*  --  167*  BUN 42* 38* 43* 40* 38*  --  29*  --  32*  CREATININE 0.87 0.74 0.65 0.72 0.68  --  0.50*  --  0.57*  CALCIUM 8.9 8.3* 9.1 8.3* 8.3*  --  8.0*  --  8.8*  MG 2.1 1.9 2.2  --  1.7  --  1.9  --   --   PHOS 2.8 3.2 3.4  --  4.1  --  2.6  --   --    GFR: Estimated Creatinine Clearance: 150.5 mL/min (A) (by C-G formula based on SCr of 0.57 mg/dL (L)). Recent Labs  Lab 02/26/19 0425 02/27/19 0427 02/28/19 0537 03/01/19 0537  WBC 11.4* 9.4 8.4 9.8    Liver Function Tests: Recent Labs  Lab 02/23/19 0853  02/24/19 0500 02/27/19 0427 02/28/19 0537  AST 67* 62* 52* 44*  ALT 95* 97* 118* 106*  ALKPHOS 74 76 73 91  BILITOT 0.6 0.6 0.9 0.5  PROT 5.7* 5.2* 5.4* 6.2*  ALBUMIN 2.6* 2.6* 2.5* 2.7*   No results for input(s): LIPASE, AMYLASE in the last 168 hours. No results for input(s): AMMONIA in the last 168 hours.  ABG    Component Value Date/Time   PHART 7.367 02/28/2019 0524   PCO2ART 65.6 (HH) 02/28/2019 0524   PO2ART 94.0 02/28/2019 0524   HCO3 37.7 (H) 02/28/2019 0524   TCO2 40 (H) 02/28/2019 0524   O2SAT 97.0 02/28/2019 0524     Coagulation Profile: No results for input(s): INR, PROTIME in the last 168 hours.  Cardiac Enzymes: No results for input(s): CKTOTAL, CKMB, CKMBINDEX, TROPONINI in the last 168 hours.  HbA1C: Hgb A1c MFr Bld  Date/Time  Value Ref Range Status  02/19/2019 07:10 PM 6.7 (H) 4.8 - 5.6 % Final    Comment:    (NOTE) Pre diabetes:          5.7%-6.4% Diabetes:              >6.4% Glycemic control for   <7.0% adults with diabetes     CBG: Recent Labs  Lab 02/28/19 1137 02/28/19 1541 02/28/19 1954 02/28/19 2324 03/01/19 0332  GLUCAP 230* 327* 296* 174* 118*     Critical care time: 35 minutes     Heber CarolinaBrent Ilana Prezioso, MD [facility] Pager: [phone] Cell: [phone] If no response, call [phone]

## 2019-03-02 ENCOUNTER — Inpatient Hospital Stay (HOSPITAL_COMMUNITY): Payer: HRSA Program

## 2019-03-02 DIAGNOSIS — U071 COVID-19: Principal | ICD-10-CM

## 2019-03-02 DIAGNOSIS — J80 Acute respiratory distress syndrome: Secondary | ICD-10-CM

## 2019-03-02 LAB — COMPREHENSIVE METABOLIC PANEL
ALT: 67 U/L — ABNORMAL HIGH (ref 0–44)
AST: 32 U/L (ref 15–41)
Albumin: 2.7 g/dL — ABNORMAL LOW (ref 3.5–5.0)
Alkaline Phosphatase: 83 U/L (ref 38–126)
Anion gap: 11 (ref 5–15)
BUN: 42 mg/dL — ABNORMAL HIGH (ref 6–20)
CO2: 37 mmol/L — ABNORMAL HIGH (ref 22–32)
Calcium: 8.2 mg/dL — ABNORMAL LOW (ref 8.9–10.3)
Chloride: 91 mmol/L — ABNORMAL LOW (ref 98–111)
Creatinine, Ser: 0.71 mg/dL (ref 0.61–1.24)
GFR calc Af Amer: >60 mL/min (ref >60)
GFR calc non Af Amer: >60 mL/min (ref >60)
Glucose, Bld: 223 mg/dL — ABNORMAL HIGH (ref 70–99)
Potassium: 4.7 mmol/L (ref 3.5–5.1)
Sodium: 139 mmol/L (ref 135–145)
Total Bilirubin: 0.4 mg/dL (ref 0.3–1.2)
Total Protein: 5.7 g/dL — ABNORMAL LOW (ref 6.5–8.1)

## 2019-03-02 LAB — POCT I-STAT 7, (LYTES, BLD GAS, ICA,H+H)
Calcium, Ion: 1.25 mmol/L (ref 1.15–1.40)
Calcium, Ion: 1.18 mmol/L (ref 1.15–1.40)
HCT: 37 % — ABNORMAL LOW (ref 39.0–52.0)
HCT: 34 % — ABNORMAL LOW (ref 39.0–52.0)
Hemoglobin: 12.6 g/dL — ABNORMAL LOW (ref 13.0–17.0)
Hemoglobin: 11.6 g/dL — ABNORMAL LOW (ref 13.0–17.0)
Patient temperature: 102.9
Patient temperature: 101.2
Potassium: 5.1 mmol/L (ref 3.5–5.1)
Potassium: 4.6 mmol/L (ref 3.5–5.1)
Sodium: 137 mmol/L (ref 135–145)
Sodium: 136 mmol/L (ref 135–145)
pCO2 arterial: >97 mmHg (ref 32.0–48.0)
pCO2 arterial: >97 mmHg (ref 32.0–48.0)
pH, Arterial: 7.139 — CL (ref 7.350–7.450)
pH, Arterial: 7.284 — ABNORMAL LOW (ref 7.350–7.450)
pO2, Arterial: 77 mmHg — ABNORMAL LOW (ref 83.0–108.0)
pO2, Arterial: 69 mmHg — ABNORMAL LOW (ref 83.0–108.0)

## 2019-03-02 LAB — GLUCOSE, CAPILLARY
Glucose-Capillary: 250 mg/dL — ABNORMAL HIGH (ref 70–99)
Glucose-Capillary: 182 mg/dL — ABNORMAL HIGH (ref 70–99)
Glucose-Capillary: 191 mg/dL — ABNORMAL HIGH (ref 70–99)
Glucose-Capillary: 197 mg/dL — ABNORMAL HIGH (ref 70–99)
Glucose-Capillary: 177 mg/dL — ABNORMAL HIGH (ref 70–99)

## 2019-03-02 LAB — CK: Total CK: 53 U/L (ref 49–397)

## 2019-03-02 MED ORDER — ATROPINE SULFATE 1 MG/10ML IJ SOSY
PREFILLED_SYRINGE | INTRAMUSCULAR | Status: AC
  Filled 2019-03-02: qty 10

## 2019-03-02 NOTE — Progress Notes (Addendum)
eLink Physician-Brief Progress Note Patient Name: Walter Oliver DOB: August 16, 1970 MRN: [id number]   Date of Service  03/02/2019  HPI/Events of Note  RN notified us of tachycardia of 130 Not a new phenomenon and has been on lopressor ordered PRN every 3 hours Stable BP On nimbex and multiple sedative infusions Current o2 sat 87. RN notes this happened after his position was changed, and is usually slow to improve Is on 100%/peep 16 which is unchanged No fever, sinus tachycardia on monitor  eICU Interventions  Check CXR and ABG now Multi factorial sinus tachycardia which is not new CT on December 13 did not show PE, is on 55 mg twice daily lovenox dosing as well  Asked RN to let us know when cxr/abg are done     Intervention Category Major Interventions: Arrhythmia - evaluation and management  Adrijana Haros G Max Romano 03/02/2019, 1:11 AM   4.30 am I called to follow up on patient ABG not in system yet but RT has written down results in her note, reviewed O2 sat is 97 HR Is down to 108-112 Continue current care RN also noted darkish brown urine, not acutely bloody Will send off a CK level with AM labs Patient had negative LE dopplers yesterday Remains fairly unchanged in terms of O2 needs and is on 55 mg twice daily of lovenox If unable to wean, may consider therapeutic AC with either IV heparin or lovenox - not changing this right now since sats have improved and o2 needs have not changed since yesterday  Please call if needed

## 2019-03-02 NOTE — Progress Notes (Signed)
ABG obtained at 0245 with result _ 7.13/ >97/70/ no reading on bicarb  Changes made to vent to PC of 30, RR of 30, with same FiO2 and Peep.  30 mins later ABG again obtained with result as follows-  7.28/.97/69/ no reading on bicarb.  RT left changes at this time.  Will consult with MD and dayshift RT about changes.

## 2019-03-02 NOTE — Progress Notes (Signed)
NAME:  Walter Oliver, MRN:  [id number], DOB:  [birth year], LOS: 18 ADMISSION DATE:  02-15-19, CONSULTATION DATE:  12/11 REFERRING MD:  Bonner Puna, CHIEF COMPLAINT:  dyspnea   Brief History   48 year old male admitted on 12/8 to [facility], then moved to [facility] February 15, 2019 for severe acute respiratory failure with hypoxemia secondary to COVID-19 pneumonia.  Past Medical History  None known  Significant Hospital Events   12/8 admission 12/11 move to [facility]; completed CCP 12/11 12/13 intubated 12/14: Difficulty with ventilator synchrony, added propofol, continued benzodiazepine and narcotic infusions.  Placed in prone position. Actemra administered 12/15 onward, proning 12/23 worsening oxygenation, increasing d-dimer, starting full dose lovenox 12/26 worsening oxygenation again  Consults:  PCCM  Procedures:  ETT 12/13 >  PICC 12/15 >   Significant Diagnostic Tests:  12/13 CTA head > Negative CTA of the head and neck. No large vessel occlusion, hemodynamically significant stenosis, or other acute vascular abnormality. 12/13 CTA chest > no definite PE was identified, but study was limited by poor contrast timing. Diffuse bilateral ground-glass airspace opacities involving all lobes. 12/27 LE doppler > neg dvt bilat (prelim)  Micro Data:  12/5 SARS COV 2 > positive 12/13 blood > NG 12/19 sputum MSSA, haemophilus  Antimicrobials:  12/7 decadron x1 12/8 methylpred  12/7 remdesivir >  Completed 10 days 12/14 actemra x 1  12/20 ceftriaxone >> 10 days planned  Interim history/subjective:   Intubated on mechanical life support critically ill  Objective   Blood pressure (!) 188/90, pulse (!) 107, temperature 99.2 F (37.3 C), temperature source Oral, resp. rate (!) 30, height 6' (1.829 m), weight 116 kg, SpO2 94 %.    Vent Mode: PCV FiO2 (%):  [100 %] 100 % Set Rate:  [22 bmp-30 bmp] 30 bmp PEEP:  [16 cmH20] 16 cmH20 Plateau Pressure:  [35 [id number] cmH20] 43 cmH20    Intake/Output Summary (Last 24 hours) at 03/02/2019 1801 Last data filed at 03/02/2019 1300 Gross per 24 hour  Intake 4211.2 ml  Output 4325 ml  Net -113.8 ml   Filed Weights   02/28/19 0500 03/01/19 0226 03/02/19 0500  Weight: 113.7 kg 116.5 kg 116 kg    Examination:  General: Critically ill intubated on mechanical life support HENT: NCAT, endotracheal tube in place PULM: Lateral ventilated breath sounds CV: Regular rate and rhythm, S1-S2 GI: Soft, nontender MSK: Normal bulk and tone Neuro: Heavily sedated on mechanical ventilation paralyzed  Resolved Hospital Problem list     Assessment & Plan:   ARDS due to COVID 19 pneumonia: severe hypoxemia, more stable overnight, slowly decreasing sedation requirements Continue mechanical ventilation per ARDS protocol Target TVol 6-8cc/kgIBW Target Plateau Pressure < 30cm H20 Target driving pressure less than 15 cm of water Target PaO2 55-65: titrate PEEP/FiO2 per protocol Check CVP daily if CVL in place Target CVP less than 4, diurese as necessary Ventilator associated pneumonia prevention protocol 12/28: no proning today, decreasing sedation, compliant with vent today. Still on 100% and 30 of PC. If vent settings improve some we need to consider tracheostomy   Need for sedation for mechanical ventilation: still has high sedation, synchrony needs Remains on Versed, Dilaudid Paralysis Scheduled oxycodone and clonazepam.  Hypertension As needed hydralazine  Elevated LFT: Improved  Increased gastric secretions/vomiting 12/24> improved Tube feeding  Staph pneumonia (MSSA, Haemophilus) Ceftriaxone x10 days Urine culture with gram-positive cocci and gram-negative coccobacillus White count stable, no fevers continue to observe  Hypokalemia: Please as needed  D-dimer Lovenox 0.5 mix per  cake subcu  Hyperglycemia SSI  Hypernatremia: improved Free water replacement  Having bowel movements, no longer  constipated MiraLAX held   Best practice:  Diet: tube feeding (on hold 12/24) Pain/Anxiety/Delirium protocol (if indicated): yes VAP protocol (if indicated): yes DVT prophylaxis: full dose lovenox GI prophylaxis: famotidine Glucose control: ssi Mobility: bed rest Code Status: full Family Communication: update given  Disposition: remain in ICU  Labs   CBC: Recent Labs  Lab 02/25/19 0500 02/26/19 0425 02/27/19 0427 02/28/19 0537 03/01/19 0537 03/01/19 0938 03/02/19 0138 03/02/19 0232  WBC 8.9 11.4* 9.4 8.4 9.8  --   --   --   NEUTROABS  --   --  7.3 6.8 8.0*  --   --   --   HGB 11.9* 11.4* 10.9* 11.9* 11.1* 10.9* 12.6* 11.6*  HCT 39.9 35.3* 33.7* 36.9* 35.9* 32.0* 37.0* 34.0*  MCV 110.8* 94.4 93.9 94.4 99.2  --   --   --   PLT 262 147* 142* 150 156  --   --   --     Basic Metabolic Panel: Recent Labs  Lab 02/24/19 0500 02/25/19 0500 02/25/19 1209 02/26/19 0425 02/27/19 0427 02/28/19 0537 03/01/19 0537 03/01/19 0938 03/02/19 0138 03/02/19 0232 03/02/19 0434  NA 145 145  --  136 136 137 139 136 137 136 139  K 3.5 4.2  --  2.9* 3.2* 3.6 4.1 4.0 5.1 4.6 4.7  CL 103 98   < > 98 97* 93* 87*  --   --   --  91*  CO2 32 40*   < > 30 30 32 38*  --   --   --  37*  GLUCOSE 158* 138*   < > 145* 118* 167* 154*  --   --   --  223*  BUN 38* 43*   < > 38* 29* 32* 39*  --   --   --  42*  CREATININE 0.74 0.65   < > 0.68 0.50* 0.57* 0.58*  --   --   --  0.71  CALCIUM 8.3* 9.1   < > 8.3* 8.0* 8.8* 8.1*  --   --   --  8.2*  MG 1.9 2.2  --  1.7 1.9  --   --   --   --   --   --   PHOS 3.2 3.4  --  4.1 2.6  --   --   --   --   --   --    < > = values in this interval not displayed.   GFR: Estimated Creatinine Clearance: 150.2 mL/min (by C-G formula based on SCr of 0.71 mg/dL). Recent Labs  Lab 02/26/19 0425 02/27/19 0427 02/28/19 0537 03/01/19 0537  WBC 11.4* 9.4 8.4 9.8    Liver Function Tests: Recent Labs  Lab 02/24/19 0500 02/27/19 0427 02/28/19 0537  03/02/19 0434  AST 62* 52* 44* 32  ALT 97* 118* 106* 67*  ALKPHOS 76 73 91 83  BILITOT 0.6 0.9 0.5 0.4  PROT 5.2* 5.4* 6.2* 5.7*  ALBUMIN 2.6* 2.5* 2.7* 2.7*   No results for input(s): LIPASE, AMYLASE in the last 168 hours. No results for input(s): AMMONIA in the last 168 hours.  ABG    Component Value Date/Time   PHART 7.284 (L) 03/02/2019 0232   PCO2ART >97.0 (HH) 03/02/2019 0232   PO2ART 69 (L) 03/02/2019 0232   HCO3 44.3 (H) 03/01/2019 0938   TCO2 47 (H) 03/01/2019 0938   O2SAT 92.0  03/01/2019 0938     Coagulation Profile: No results for input(s): INR, PROTIME in the last 168 hours.  Cardiac Enzymes: Recent Labs  Lab 03/02/19 0434  CKTOTAL 53    HbA1C: Hgb A1c MFr Bld  Date/Time Value Ref Range Status  2018/09/21 07:10 PM 6.7 (H) 4.8 - 5.6 % Final    Comment:    (NOTE) Pre diabetes:          5.7%-6.4% Diabetes:              >6.4% Glycemic control for   <7.0% adults with diabetes     CBG: Recent Labs  Lab 03/01/19 2336 03/02/19 0346 03/02/19 0737 03/02/19 1126 03/02/19 1625  GLUCAP 217* 250* 182* 191* 197*     This patient is critically ill with multiple organ system failure; which, requires frequent high complexity decision making, assessment, support, evaluation, and titration of therapies. This was completed through the application of advanced monitoring technologies and extensive interpretation of multiple databases. During this encounter critical care time was devoted to patient care services described in this note for 33 minutes.  Josephine IgoBradley L Telly Broberg, DO [facility] 03/02/2019 6:01 PM

## 2019-03-02 NOTE — Progress Notes (Addendum)
0615- confirmed w the lab, they are adding on ck level to previous specimen. No need to collect a new sample. ---------------------------------------------  8616 - Call from Dr. Jeannene Patella - provided current update on pt. ----------------------------------------------  0030 Notified Elink of HR sustained in 120s - no effect with PRN labetalol and PRN dilaudid, sats 84-85% after repositioning and darkening color of urine.

## 2019-03-02 NOTE — Progress Notes (Signed)
Talked to Patient's son, Jori Moll and gave him a quick update. Answered all concerns and questions.

## 2019-03-02 NOTE — Progress Notes (Signed)
Nutrition Follow-up  DOCUMENTATION CODES:   Obesity unspecified  INTERVENTION:   -Continue 15 ml liquid MVI daily -Continue 200 mg zinc sulfate daily -Tube Feeding:  Vital AF 1.2 at 40 ml/hr Pro-Stat 60 mL TID (6 packets/day) Provides 192 g of protein, 1952 kcals, 778 mL of free water Meets 100% of protein needs, 100% calorie needs  NUTRITION DIAGNOSIS:   Inadequate oral intake related to acute illness as evidenced by NPO status.  Being addressed via TF  GOAL:   Patient will meet greater than or equal to 90% of their needs  Met with TF  MONITOR:   Labs, Weight trends, Skin, PO intake, Vent status, TF tolerance  REASON FOR ASSESSMENT:   Consult, Ventilator Enteral/tube feeding initiation and management  ASSESSMENT:   48 yo male admitted with acute respiratory failure due to COVID-19 pneumonia ultimately requiring intubation, AKI. PMH includes DM, anxiety, HTN  12/11 Admit 12/13 Intubated  12/16 Cortrak placed  Patient is currently intubated on ventilator support MV: 12.5 L/min Temp (24hrs), Avg:100.1 F (37.8 C), Min:99.2 F (37.3 C), Max:102.9 F (39.4 C)  Reviewed I/O's: +195 ml x 24 hours and -811 ml since 02/16/19  UOP: 4.9 L x 24 hours  MAP: 116  Per chart review, pt continues to require proning. Per MD notes, pt with worsening oxygenation and may require trach this week.   Pt continues to receive TF via cortrak tube: Vital AF 1.2 @ 40 ml/hr and 30 ml Prostat 4 times daily. Regimen provides 1952 kcals, 192 grams protein, and 778 ml free water daily (400 ml free water flush: 2400 ml, total free water: 3178 ml free water), meeting 100% of estimated kcal and protein needs.  Medications reviewed and include nimbex, dilaudid, and versed.  Labs reviewed: CBGS: 182 (inpatient orders for glycemic control are 0-20 units insulin aspart every 4 hours and 20 units insulin detemir daily).   Diet Order:   Diet Order            Diet NPO time specified  Diet  effective now              EDUCATION NEEDS:   Not appropriate for education at this time  Skin:  Skin Assessment: Skin Integrity Issues: Skin Integrity Issues:: Stage II Stage II: lt ear  Last BM:  03/02/19 (via rectal tube)  Height:   Ht Readings from Last 1 Encounters:  02/28/19 6' (1.829 m)    Weight:   Wt Readings from Last 1 Encounters:  03/02/19 116 kg    Ideal Body Weight:  80.9 kg  BMI:  Body mass index is 34.68 kg/m.  Estimated Nutritional Needs:   Kcal:  5364-6803 kcals  Protein:  162-202 g  Fluid:  >/= 1.8 L    Gerson Fauth A. Jimmye Norman, RD, LDN, [place] Registered Dietitian II Certified Diabetes Care and Education Specialist Pager: [phone] After hours Pager: [phone]

## 2019-03-03 ENCOUNTER — Inpatient Hospital Stay (HOSPITAL_COMMUNITY): Payer: HRSA Program

## 2019-03-03 DIAGNOSIS — J9601 Acute respiratory failure with hypoxia: Secondary | ICD-10-CM

## 2019-03-03 DIAGNOSIS — R7989 Other specified abnormal findings of blood chemistry: Secondary | ICD-10-CM

## 2019-03-03 LAB — CULTURE, RESPIRATORY W GRAM STAIN

## 2019-03-03 LAB — URINE CULTURE: Culture: NO GROWTH

## 2019-03-03 LAB — CBC
HCT: 32.2 % — ABNORMAL LOW (ref 39.0–52.0)
Hemoglobin: 10.1 g/dL — ABNORMAL LOW (ref 13.0–17.0)
MCH: 30.8 pg (ref 26.0–34.0)
MCHC: 31.4 g/dL (ref 30.0–36.0)
MCV: 98.2 fL (ref 80.0–100.0)
Platelets: 196 10*3/uL (ref 150–400)
RBC: 3.28 MIL/uL — ABNORMAL LOW (ref 4.22–5.81)
RDW: 16.4 % — ABNORMAL HIGH (ref 11.5–15.5)
WBC: 14 10*3/uL — ABNORMAL HIGH (ref 4.0–10.5)
nRBC: 0 % (ref 0.0–0.2)

## 2019-03-03 LAB — COMPREHENSIVE METABOLIC PANEL
ALT: 65 U/L — ABNORMAL HIGH (ref 0–44)
AST: 37 U/L (ref 15–41)
Albumin: 2.8 g/dL — ABNORMAL LOW (ref 3.5–5.0)
Alkaline Phosphatase: 77 U/L (ref 38–126)
Anion gap: 6 (ref 5–15)
BUN: 39 mg/dL — ABNORMAL HIGH (ref 6–20)
CO2: 42 mmol/L — ABNORMAL HIGH (ref 22–32)
Calcium: 8.5 mg/dL — ABNORMAL LOW (ref 8.9–10.3)
Chloride: 90 mmol/L — ABNORMAL LOW (ref 98–111)
Creatinine, Ser: 0.58 mg/dL — ABNORMAL LOW (ref 0.61–1.24)
GFR calc Af Amer: >60 mL/min (ref >60)
GFR calc non Af Amer: >60 mL/min (ref >60)
Glucose, Bld: 213 mg/dL — ABNORMAL HIGH (ref 70–99)
Potassium: 4 mmol/L (ref 3.5–5.1)
Sodium: 138 mmol/L (ref 135–145)
Total Bilirubin: 0.6 mg/dL (ref 0.3–1.2)
Total Protein: 5.9 g/dL — ABNORMAL LOW (ref 6.5–8.1)

## 2019-03-03 LAB — GLUCOSE, CAPILLARY
Glucose-Capillary: 153 mg/dL — ABNORMAL HIGH (ref 70–99)
Glucose-Capillary: 161 mg/dL — ABNORMAL HIGH (ref 70–99)
Glucose-Capillary: 196 mg/dL — ABNORMAL HIGH (ref 70–99)
Glucose-Capillary: 201 mg/dL — ABNORMAL HIGH (ref 70–99)
Glucose-Capillary: 203 mg/dL — ABNORMAL HIGH (ref 70–99)
Glucose-Capillary: 242 mg/dL — ABNORMAL HIGH (ref 70–99)

## 2019-03-03 MED ORDER — CARVEDILOL 12.5 MG PO TABS
12.5000 mg | ORAL_TABLET | Freq: Two times a day (BID) | ORAL | Status: DC
  Administered 2019-03-03 – 2019-03-07 (×9): 12.5 mg via ORAL
  Filled 2019-03-03 (×12): qty 1

## 2019-03-03 NOTE — Plan of Care (Addendum)
Pt still on nimbex, dilaudid, and versed. Pt vent setting 100% FiO2, PEEP 14, RR 30. Family was updated and questions were answered.   Problem: Education: Goal: Knowledge of risk factors and measures for prevention of condition will improve Outcome: Progressing   Problem: Coping: Goal: Psychosocial and spiritual needs will be supported Outcome: Progressing   Problem: Respiratory: Goal: Will maintain a patent airway Outcome: Progressing Goal: Complications related to the disease process, condition or treatment will be avoided or minimized Outcome: Progressing   Problem: Education: Goal: Knowledge of General Education information will improve Description: Including pain rating scale, medication(s)/side effects and non-pharmacologic comfort measures Outcome: Progressing   Problem: Health Behavior/Discharge Planning: Goal: Ability to manage health-related needs will improve Outcome: Progressing   Problem: Clinical Measurements: Goal: Ability to maintain clinical measurements within normal limits will improve Outcome: Progressing Goal: Will remain free from infection Outcome: Progressing Goal: Diagnostic test results will improve Outcome: Progressing Goal: Respiratory complications will improve Outcome: Progressing Goal: Cardiovascular complication will be avoided Outcome: Progressing   Problem: Activity: Goal: Risk for activity intolerance will decrease Outcome: Progressing   Problem: Nutrition: Goal: Adequate nutrition will be maintained Outcome: Progressing   Problem: Coping: Goal: Level of anxiety will decrease Outcome: Progressing   Problem: Elimination: Goal: Will not experience complications related to bowel motility Outcome: Progressing Goal: Will not experience complications related to urinary retention Outcome: Progressing   Problem: Pain Managment: Goal: General experience of comfort will improve Outcome: Progressing   Problem: Safety: Goal: Ability  to remain free from injury will improve Outcome: Progressing   Problem: Skin Integrity: Goal: Risk for impaired skin integrity will decrease Outcome: Progressing

## 2019-03-03 NOTE — Progress Notes (Signed)
Expired bag of 500 mg in 100 mL NS of Ketamine was wasted with Darrick Grinder.

## 2019-03-03 NOTE — Progress Notes (Signed)
NAME:  Walter Oliver, MRN:  [id number], DOB:  1970/08/29, LOS: 16 ADMISSION DATE:  02-25-19, CONSULTATION DATE:  12/11 REFERRING MD:  Bonner Puna, CHIEF COMPLAINT:  dyspnea   Brief History   48 year old male admitted on 12/8 to [facility], then moved to [facility] 2019/02/25 for severe acute respiratory failure with hypoxemia secondary to COVID-19 pneumonia.  Past Medical History  None known  Significant Hospital Events   12/8 admission 12/11 move to [facility]; completed CCP 12/11 12/13 intubated 12/14: Difficulty with ventilator synchrony, added propofol, continued benzodiazepine and narcotic infusions.  Placed in prone position. Actemra administered 12/15 onward, proning 12/23 worsening oxygenation, increasing d-dimer, starting full dose lovenox 12/26 worsening oxygenation again 12/29 still with much difficulty with sedation, high vent settings, 100% 30PC  Consults:  PCCM  Procedures:  ETT 12/13 >  PICC 12/15 >   Significant Diagnostic Tests:  12/13 CTA head > Negative CTA of the head and neck. No large vessel occlusion, hemodynamically significant stenosis, or other acute vascular abnormality. 12/13 CTA chest > no definite PE was identified, but study was limited by poor contrast timing. Diffuse bilateral ground-glass airspace opacities involving all lobes. 12/27 LE doppler > neg dvt bilat (prelim)  Micro Data:  12/5 SARS COV 2 > positive 12/13 blood > NG 12/19 sputum MSSA, haemophilus  Antimicrobials:  12/7 decadron x1 12/8 methylpred  12/7 remdesivir >  Completed 10 days 12/14 actemra x 1  12/20 ceftriaxone >> 10 days planned  Interim history/subjective:   Critically ill intubated on mechanical life support  Objective   Blood pressure (!) 161/79, pulse (!) 121, temperature (!) 100.8 F (38.2 C), temperature source Axillary, resp. rate (!) 30, height 6' (1.829 m), weight 114.5 kg, SpO2 90 %.    Vent Mode: PCV FiO2 (%):  [90 %-100 %] 100 % Set Rate:  [30 bmp] 30  bmp PEEP:  [14 cmH20-16 cmH20] 14 cmH20 Plateau Pressure:  [43 cmH20-45 cmH20] 43 cmH20   Intake/Output Summary (Last 24 hours) at 03/03/2019 1536 Last data filed at 03/03/2019 1200 Gross per 24 hour  Intake 3042.99 ml  Output 1526 ml  Net 1516.99 ml   Filed Weights   03/01/19 0226 03/02/19 0500 03/03/19 0448  Weight: 116.5 kg 116 kg 114.5 kg    Examination:  General: Critically ill intubated on mechanical life support sedated paralyzed HENT: NCAT, endotracheal tube in place PULM: Bilateral ventilated breath sounds CV: Regular rate and rhythm, S1-S2 GI: Soft, nontender MSK: Normal bulk and tone Neuro: Heavily sedated on mechanical ventilation, continuous paralysis  Resolved Hospital Problem list     Assessment & Plan:   ARDS due to COVID 19 pneumonia: severe hypoxemia, more stable overnight, slowly decreasing sedation requirements Continue mechanical ventilation per ARDS protocol Target TVol 6-8cc/kgIBW Target Plateau Pressure < 30cm H20 Target driving pressure less than 15 cm of water Target PaO2 55-65: titrate PEEP/FiO2 per protocol Check CVP daily if CVL in place Target CVP less than 4, diurese as necessary Ventilator associated pneumonia prevention protocol 12/29: Again no proning today.  Remains on high vent settings.  Able to come down some on sedation.  Still has good synchrony with mechanical support.  Need for sedation for mechanical ventilation: still has high sedation, synchrony needs Remains on Versed plus Dilaudid plus continuous paralysis with Nimbex Scheduled oral oxycodone plus clonazepam  Elevated LFT: Improved  Hypertension Coreg As needed hydralazine, labetalol  Increased gastric secretions/vomiting 12/24> improved Tube feeding  Staph pneumonia (MSSA, Haemophilus, Enterobacter cloaca) Ceftriaxone x10 days culture  with gram-positive cocci and gram-negative coccobacillus White blood cell count slightly increased, low-grade temperature  remains. If patient sepsis syndrome looks like it is continuing to worsen may need to consider change in antimicrobial coverage  Hypokalemia: Replete as needed, on CVVHD  D-dimer positive  Lovenox  Hyperglycemia SSI  Hypernatremia: Improved  Having bowel movements, no longer constipated Bowel regimen held   Best practice:  Diet: tube feeding (on hold 12/24) Pain/Anxiety/Delirium protocol (if indicated): yes VAP protocol (if indicated): yes DVT prophylaxis: full dose lovenox GI prophylaxis: famotidine Glucose control: ssi Mobility: bed rest Code Status: full Family Communication: I will call family Disposition: remain in ICU  Labs   CBC: Recent Labs  Lab 02/26/19 0425 02/27/19 0427 02/28/19 0537 03/01/19 0537 03/01/19 0938 03/02/19 0138 03/02/19 0232 03/03/19 0528  WBC 11.4* 9.4 8.4 9.8  --   --   --  14.0*  NEUTROABS  --  7.3 6.8 8.0*  --   --   --   --   HGB 11.4* 10.9* 11.9* 11.1* 10.9* 12.6* 11.6* 10.1*  HCT 35.3* 33.7* 36.9* 35.9* 32.0* 37.0* 34.0* 32.2*  MCV 94.4 93.9 94.4 99.2  --   --   --  98.2  PLT 147* 142* 150 156  --   --   --  196    Basic Metabolic Panel: Recent Labs  Lab 02/25/19 0500 02/25/19 1209 02/26/19 0425 02/27/19 0427 02/28/19 0537 03/01/19 0537 03/01/19 0938 03/02/19 0138 03/02/19 0232 03/02/19 0434 03/03/19 0528  NA 145  --  136 136 137 139 136 137 136 139 138  K 4.2  --  2.9* 3.2* 3.6 4.1 4.0 5.1 4.6 4.7 4.0  CL 98   < > 98 97* 93* 87*  --   --   --  91* 90*  CO2 40*   < > 30 30 32 38*  --   --   --  37* 42*  GLUCOSE 138*   < > 145* 118* 167* 154*  --   --   --  223* 213*  BUN 43*   < > 38* 29* 32* 39*  --   --   --  42* 39*  CREATININE 0.65   < > 0.68 0.50* 0.57* 0.58*  --   --   --  0.71 0.58*  CALCIUM 9.1   < > 8.3* 8.0* 8.8* 8.1*  --   --   --  8.2* 8.5*  MG 2.2  --  1.7 1.9  --   --   --   --   --   --   --   PHOS 3.4  --  4.1 2.6  --   --   --   --   --   --   --    < > = values in this interval not displayed.    GFR: Estimated Creatinine Clearance: 149.2 mL/min (A) (by C-G formula based on SCr of 0.58 mg/dL (L)). Recent Labs  Lab 02/27/19 0427 02/28/19 0537 03/01/19 0537 03/03/19 0528  WBC 9.4 8.4 9.8 14.0*    Liver Function Tests: Recent Labs  Lab 02/27/19 0427 02/28/19 0537 03/02/19 0434 03/03/19 0528  AST 52* 44* 32 37  ALT 118* 106* 67* 65*  ALKPHOS 73 91 83 77  BILITOT 0.9 0.5 0.4 0.6  PROT 5.4* 6.2* 5.7* 5.9*  ALBUMIN 2.5* 2.7* 2.7* 2.8*   No results for input(s): LIPASE, AMYLASE in the last 168 hours. No results for input(s): AMMONIA in the last  168 hours.  ABG    Component Value Date/Time   PHART 7.284 (L) 03/02/2019 0232   PCO2ART >97.0 (HH) 03/02/2019 0232   PO2ART 69 (L) 03/02/2019 0232   HCO3 44.3 (H) 03/01/2019 0938   TCO2 47 (H) 03/01/2019 0938   O2SAT 92.0 03/01/2019 0938     Coagulation Profile: No results for input(s): INR, PROTIME in the last 168 hours.  Cardiac Enzymes: Recent Labs  Lab 03/02/19 0434  CKTOTAL 53    HbA1C: Hgb A1c MFr Bld  Date/Time Value Ref Range Status  02/26/2019 07:10 PM 6.7 (H) 4.8 - 5.6 % Final    Comment:    (NOTE) Pre diabetes:          5.7%-6.4% Diabetes:              >6.4% Glycemic control for   <7.0% adults with diabetes     CBG: Recent Labs  Lab 03/02/19 2014 03/02/19 2354 03/03/19 0338 03/03/19 0747 03/03/19 1147  GLUCAP 177* 153* 242* 161* 196*    This patient is critically ill with multiple organ system failure; which, requires frequent high complexity decision making, assessment, support, evaluation, and titration of therapies. This was completed through the application of advanced monitoring technologies and extensive interpretation of multiple databases. During this encounter critical care time was devoted to patient care services described in this note for 33 minutes.   Josephine IgoBradley L Marlee Trentman, DO [facility] 03/03/2019 3:40 PM

## 2019-03-03 NOTE — Progress Notes (Signed)
PHARMACY - PHYSICIAN COMMUNICATION CRITICAL VALUE ALERT - BLOOD CULTURE IDENTIFICATION (BCID)  Walter Oliver is an 48 y.o. male who presented to [facility] on Feb 25, 2019 .  Assessment:  1/2 BCx with GPC. Other cultures: 12/8 blood>>ngtd 12/11 MRSA PCR: neg 12/13 BCx: negF 12/19 TA:  Moderate staph (MSSA), mod H.flu (b-lactamase negative)  12/26 TA: rare staph (MSSA), rare Enterobacter cloacae (pan-sens except cefazolin).    12/28 BCx (only 2 bottles drawn, both aerobic):  1 bottle with GPC 12/28 TA: few Enterobacter species  Name of physician (or Provider) Contacted: Dr. Valeta Harms  Current antibiotics: Ceftriaxone 2 gm IV Q 24 hours x 10 days.  Last dose on 12/29.  Changes to prescribed antibiotics recommended:  None, Continue CTX today, consider escalating abx coverage tomorrow if worsening.  Gretta Arab PharmD, BCPS Clinical pharmacist phone 7am- 5pm: [phone] 03/03/2019 4:31 PM

## 2019-03-04 ENCOUNTER — Inpatient Hospital Stay (HOSPITAL_COMMUNITY): Payer: HRSA Program

## 2019-03-04 DIAGNOSIS — Z9911 Dependence on respirator [ventilator] status: Secondary | ICD-10-CM

## 2019-03-04 LAB — COMPREHENSIVE METABOLIC PANEL
ALT: 68 U/L — ABNORMAL HIGH (ref 0–44)
AST: 38 U/L (ref 15–41)
Albumin: 2.6 g/dL — ABNORMAL LOW (ref 3.5–5.0)
Alkaline Phosphatase: 76 U/L (ref 38–126)
Anion gap: 8 (ref 5–15)
BUN: 38 mg/dL — ABNORMAL HIGH (ref 6–20)
CO2: 39 mmol/L — ABNORMAL HIGH (ref 22–32)
Calcium: 8.4 mg/dL — ABNORMAL LOW (ref 8.9–10.3)
Chloride: 90 mmol/L — ABNORMAL LOW (ref 98–111)
Creatinine, Ser: 0.59 mg/dL — ABNORMAL LOW (ref 0.61–1.24)
GFR calc Af Amer: >60 mL/min (ref >60)
GFR calc non Af Amer: >60 mL/min (ref >60)
Glucose, Bld: 206 mg/dL — ABNORMAL HIGH (ref 70–99)
Potassium: 3.9 mmol/L (ref 3.5–5.1)
Sodium: 137 mmol/L (ref 135–145)
Total Bilirubin: 0.4 mg/dL (ref 0.3–1.2)
Total Protein: 6 g/dL — ABNORMAL LOW (ref 6.5–8.1)

## 2019-03-04 LAB — POCT I-STAT 7, (LYTES, BLD GAS, ICA,H+H)
Acid-Base Excess: 16 mmol/L — ABNORMAL HIGH (ref 0.0–2.0)
Acid-base deficit: 5 mmol/L — ABNORMAL HIGH (ref 0.0–2.0)
Bicarbonate: 20.8 mmol/L (ref 20.0–28.0)
Bicarbonate: 42.3 mmol/L — ABNORMAL HIGH (ref 20.0–28.0)
Calcium, Ion: 1.13 mmol/L — ABNORMAL LOW (ref 1.15–1.40)
Calcium, Ion: 1.19 mmol/L (ref 1.15–1.40)
HCT: 25 % — ABNORMAL LOW (ref 39.0–52.0)
HCT: 30 % — ABNORMAL LOW (ref 39.0–52.0)
Hemoglobin: 8.5 g/dL — ABNORMAL LOW (ref 13.0–17.0)
Hemoglobin: 10.2 g/dL — ABNORMAL LOW (ref 13.0–17.0)
O2 Saturation: 96 %
O2 Saturation: 80 %
Patient temperature: 35.2
Patient temperature: 99.4
Potassium: 3.8 mmol/L (ref 3.5–5.1)
Potassium: 3.8 mmol/L (ref 3.5–5.1)
Sodium: 139 mmol/L (ref 135–145)
Sodium: 136 mmol/L (ref 135–145)
TCO2: 22 mmol/L (ref 22–32)
TCO2: 44 mmol/L — ABNORMAL HIGH (ref 22–32)
pCO2 arterial: 35.7 mmHg (ref 32.0–48.0)
pCO2 arterial: 61.7 mmHg — ABNORMAL HIGH (ref 32.0–48.0)
pH, Arterial: 7.364 (ref 7.350–7.450)
pH, Arterial: 7.447 (ref 7.350–7.450)
pO2, Arterial: 75 mmHg — ABNORMAL LOW (ref 83.0–108.0)
pO2, Arterial: 45 mmHg — ABNORMAL LOW (ref 83.0–108.0)

## 2019-03-04 LAB — GLUCOSE, CAPILLARY
Glucose-Capillary: 139 mg/dL — ABNORMAL HIGH (ref 70–99)
Glucose-Capillary: 149 mg/dL — ABNORMAL HIGH (ref 70–99)
Glucose-Capillary: 167 mg/dL — ABNORMAL HIGH (ref 70–99)
Glucose-Capillary: 181 mg/dL — ABNORMAL HIGH (ref 70–99)
Glucose-Capillary: 190 mg/dL — ABNORMAL HIGH (ref 70–99)
Glucose-Capillary: 200 mg/dL — ABNORMAL HIGH (ref 70–99)
Glucose-Capillary: 210 mg/dL — ABNORMAL HIGH (ref 70–99)

## 2019-03-04 LAB — CBC
HCT: 31.8 % — ABNORMAL LOW (ref 39.0–52.0)
Hemoglobin: 9.9 g/dL — ABNORMAL LOW (ref 13.0–17.0)
MCH: 30.6 pg (ref 26.0–34.0)
MCHC: 31.1 g/dL (ref 30.0–36.0)
MCV: 98.1 fL (ref 80.0–100.0)
Platelets: 204 10*3/uL (ref 150–400)
RBC: 3.24 MIL/uL — ABNORMAL LOW (ref 4.22–5.81)
RDW: 17 % — ABNORMAL HIGH (ref 11.5–15.5)
WBC: 16 10*3/uL — ABNORMAL HIGH (ref 4.0–10.5)
nRBC: 0 % (ref 0.0–0.2)

## 2019-03-04 LAB — CULTURE, BLOOD (ROUTINE X 2): Special Requests: ADEQUATE

## 2019-03-04 MED ORDER — ACETAMINOPHEN 160 MG/5ML PO SOLN
650.0000 mg | Freq: Four times a day (QID) | ORAL | Status: DC | PRN
  Administered 2019-03-04: 650 mg via ORAL

## 2019-03-04 MED ORDER — STERILE WATER FOR INJECTION IJ SOLN
INTRAMUSCULAR | Status: AC
  Filled 2019-03-04: qty 10

## 2019-03-04 MED ORDER — VECURONIUM BROMIDE 10 MG IV SOLR
10.0000 mg | INTRAVENOUS | Status: DC | PRN
  Administered 2019-03-04 (×5): 10 mg via INTRAVENOUS
  Filled 2019-03-04 (×5): qty 10

## 2019-03-04 MED ORDER — SODIUM CHLORIDE 0.9 % IV SOLN
0.0000 ug/kg/min | INTRAVENOUS | Status: DC
  Administered 2019-03-04: 3 ug/kg/min via INTRAVENOUS
  Administered 2019-03-05 – 2019-03-06 (×2): 2 ug/kg/min via INTRAVENOUS
  Administered 2019-03-06: 1.94 ug/kg/min via INTRAVENOUS
  Administered 2019-03-07: 2 ug/kg/min via INTRAVENOUS
  Filled 2019-03-04 (×8): qty 20

## 2019-03-04 MED ORDER — CISATRACURIUM BOLUS VIA INFUSION
0.1000 mg/kg | Freq: Once | INTRAVENOUS | Status: AC
  Administered 2019-03-04: 11.6 mg via INTRAVENOUS
  Filled 2019-03-04: qty 12

## 2019-03-04 MED ORDER — EPINEPHRINE 1 MG/10ML IJ SOSY
PREFILLED_SYRINGE | INTRAMUSCULAR | Status: AC
  Filled 2019-03-04: qty 10

## 2019-03-04 NOTE — Progress Notes (Signed)
Pt requiring ETT exchange after continuous major cuff leak all night. MD Vanita Ingles paged to come to bedside. CRNA in house notified and on standby. [facility] notified as well. At bedside are 2 RN's, Charge RN, MD Vanita Ingles, 2 RTs, and code cart.  ETT exchanged. O2 sats did decrease as expected. HR remained stable. BP remained stable. O2 sats quickly recovered.  Will continue to monitor patient.

## 2019-03-04 NOTE — Plan of Care (Signed)
Pt still on the ventilator. FiO2 at 90% 14 PEEP RR 30. Paralytic turned off this morning. Son and wife were updated on plan of care and all questions were answered. Video call scheduled for 1400 today.  Problem: Education: Goal: Knowledge of risk factors and measures for prevention of condition will improve Outcome: Progressing   Problem: Coping: Goal: Psychosocial and spiritual needs will be supported Outcome: Progressing   Problem: Respiratory: Goal: Will maintain a patent airway Outcome: Progressing Goal: Complications related to the disease process, condition or treatment will be avoided or minimized Outcome: Progressing   Problem: Education: Goal: Knowledge of General Education information will improve Description: Including pain rating scale, medication(s)/side effects and non-pharmacologic comfort measures Outcome: Progressing   Problem: Health Behavior/Discharge Planning: Goal: Ability to manage health-related needs will improve Outcome: Progressing   Problem: Clinical Measurements: Goal: Ability to maintain clinical measurements within normal limits will improve Outcome: Progressing Goal: Will remain free from infection Outcome: Progressing Goal: Diagnostic test results will improve Outcome: Progressing Goal: Respiratory complications will improve Outcome: Progressing Goal: Cardiovascular complication will be avoided Outcome: Progressing   Problem: Activity: Goal: Risk for activity intolerance will decrease Outcome: Progressing   Problem: Nutrition: Goal: Adequate nutrition will be maintained Outcome: Progressing   Problem: Coping: Goal: Level of anxiety will decrease Outcome: Progressing   Problem: Elimination: Goal: Will not experience complications related to bowel motility Outcome: Progressing Goal: Will not experience complications related to urinary retention Outcome: Progressing   Problem: Pain Managment: Goal: General experience of comfort will  improve Outcome: Progressing   Problem: Safety: Goal: Ability to remain free from injury will improve Outcome: Progressing   Problem: Skin Integrity: Goal: Risk for impaired skin integrity will decrease Outcome: Progressing   Problem: Skin Integrity: Goal: Risk for impaired skin integrity will decrease Outcome: Progressing

## 2019-03-04 NOTE — Progress Notes (Signed)
All belongings given to wife via [facility] and security

## 2019-03-04 NOTE — Progress Notes (Signed)
Assisted tele visit to patient with son, Jori Moll.  Maryelizabeth Rowan, RN

## 2019-03-04 NOTE — Progress Notes (Signed)
Pt having continuous cuff leak and decision to exchange tube was made.  Boogie used to exchange tube from 7.5 to 8.0.  Pt sats dropped to 18 during exchange but quickly returned to normal after.  No HR changes to note, all other vitals stable throughout and after. MD present during exchange.  Rt will continue to monitor.

## 2019-03-04 NOTE — Progress Notes (Signed)
eLink Physician-Brief Progress Note Patient Name: Gamble Enderle DOB: 1970-08-19 MRN: [id number]   Date of Service  03/04/2019  HPI/Events of Note  Pt with extreme ventilator dyssynchrony and high peak airway pressures in the 50's, he's required vecuronium hourly x past 3 hours and continues to be dyssynchronous.  eICU Interventions  Will re-start Nimbex infusion        Tiyonna Sardinha U Candra Wegner 03/04/2019, 10:16 PM

## 2019-03-04 NOTE — Progress Notes (Signed)
NAME:  Walter Oliver, MRN:  [id number], DOB:  11/24/1970, LOS: 19 ADMISSION DATE:  02/19/19, CONSULTATION DATE:  12/11 REFERRING MD:  Jarvis Newcomer, CHIEF COMPLAINT:  dyspnea   Brief History   48 year old male admitted on 12/8 to [facility], then moved to [facility] February 19, 2019 for severe acute respiratory failure with hypoxemia secondary to COVID-19 pneumonia.  Past Medical History  None known  Significant Hospital Events   12/8 admission 12/11 move to [facility]; completed CCP 12/11 12/13 intubated 12/14: Difficulty with ventilator synchrony, added propofol, continued benzodiazepine and narcotic infusions.  Placed in prone position. Actemra administered 12/15 onward, proning 12/23 worsening oxygenation, increasing d-dimer, starting full dose lovenox 12/26 worsening oxygenation again 12/29 still with much difficulty with sedation, high vent settings, 100% 30PC 12/30 overnight ETT exchange by RT  Consults:  PCCM  Procedures:  ETT 12/13 >  PICC 12/15 >   Significant Diagnostic Tests:  12/13 CTA head > Negative CTA of the head and neck. No large vessel occlusion, hemodynamically significant stenosis, or other acute vascular abnormality. 12/13 CTA chest > no definite PE was identified, but study was limited by poor contrast timing. Diffuse bilateral ground-glass airspace opacities involving all lobes. 12/27 LE doppler > neg dvt bilat (prelim)  Micro Data:  12/5 SARS COV 2 > positive 12/13 blood > NG 12/19 sputum MSSA, haemophilus 12/28 enterobacter? Colonized  12/28 Bcx X 1 bottle coag neg staph   Antimicrobials:  12/7 decadron x1 12/8 methylpred  12/7 remdesivir >  Completed 10 days 12/14 actemra x 1  12/20 ceftriaxone >> 10 days planned  Interim history/subjective:   Intubated critically ill on mechanical life support.  ET tube exchange overnight.  Objective   Blood pressure (!) 153/80, pulse (!) 111, temperature 99.8 F (37.7 C), resp. rate (!) 30, height 6' (1.829 m), weight  116 kg, SpO2 (!) 89 %.    Vent Mode: PCV FiO2 (%):  [100 %] 100 % Set Rate:  [30 bmp] 30 bmp PEEP:  [14 cmH20] 14 cmH20 Plateau Pressure:  [39 cmH20-44 cmH20] 42 cmH20   Intake/Output Summary (Last 24 hours) at 03/04/2019 1497 Last data filed at 03/04/2019 0700 Gross per 24 hour  Intake 3284.13 ml  Output 2051 ml  Net 1233.13 ml   Filed Weights   03/02/19 0500 03/03/19 0448 03/04/19 0500  Weight: 116 kg 114.5 kg 116 kg    Examination:  General: Intubated critically ill on mechanical life support heavily sedated HENT: NCAT, endotracheal tube in place no air leak PULM: Bilateral ventilated breath sounds CV: Regular rate rhythm, S1-S2 GI: Soft nontender nondistended MSK: Normal bulk and tone Neuro: Unresponsive to pain, sedated on mechanical ventilation  Resolved Hospital Problem list     Assessment & Plan:   ARDS due to COVID 19 pneumonia: severe hypoxemia, more stable overnight, slowly decreasing sedation requirements Continue mechanical ventilation per ARDS protocol Target TVol 6-8cc/kgIBW Target Plateau Pressure < 30cm H20 Target driving pressure less than 15 cm of water Target PaO2 55-65: titrate PEEP/FiO2 per protocol As long as PaO2 to FiO2 ratio is less than 1:150 position in prone position for 16 hours a day Check CVP daily if CVL in place Target CVP less than 4, diurese as necessary Ventilator associated pneumonia prevention protocol 12/30: Tube exchanged overnight due to significant cuff leak.  Did have significant hypoxemic event during this.  Plans to stop continuous paralysis today.  Need for sedation for mechanical ventilation: still has high sedation, synchrony needs Remains on lower dose of Versed  and Dilaudid Stop continuous paralysis with Nimbex As needed vecuronium available for ventilator dyssynchrony. Continue oral sedatives with oxycodone plus clonazepam  Elevated LFT: Improved  Hypertension Continue Coreg plus as needed hydralazine and  labetalol  Staph pneumonia (MSSA, Haemophilus, Enterobacter cloaca) Completed 10 days ceftriaxone, last dose 03/05/2019 Likely some mixture of colonization.  Continue to observe for any signs of infection.  No fevers. White blood cell count however is rising. Low threshold for broad-spectrum antimicrobials.  Hypokalemia: Replete electrolytes as needed  D-dimer positive  Lovenox  Hyperglycemia SSI  Hypernatremia: Improved  Having bowel movements, no longer constipated Holding bowel regimen.   Best practice:  Diet: tube feeding (on hold 12/24) Pain/Anxiety/Delirium protocol (if indicated): yes VAP protocol (if indicated): yes DVT prophylaxis: full dose lovenox GI prophylaxis: famotidine Glucose control: ssi Mobility: bed rest Code Status: full Family Communication: I will attempt to reach out to family. Disposition: remain in ICU  Labs   CBC: Recent Labs  Lab 02/27/19 0427 02/28/19 0537 03/01/19 0537 03/02/19 0138 03/02/19 0232 03/03/19 0528 03/04/19 0359 03/04/19 0500  WBC 9.4 8.4 9.8  --   --  14.0*  --  16.0*  NEUTROABS 7.3 6.8 8.0*  --   --   --   --   --   HGB 10.9* 11.9* 11.1* 12.6* 11.6* 10.1* 10.2* 9.9*  HCT 33.7* 36.9* 35.9* 37.0* 34.0* 32.2* 30.0* 31.8*  MCV 93.9 94.4 99.2  --   --  98.2  --  98.1  PLT 142* 150 156  --   --  196  --  710    Basic Metabolic Panel: Recent Labs  Lab 02/26/19 0425 02/27/19 0427 02/28/19 0537 03/01/19 0537 03/02/19 0232 03/02/19 0434 03/03/19 0528 03/04/19 0359 03/04/19 0500  NA 136 136 137 139 136 139 138 136 137  K 2.9* 3.2* 3.6 4.1 4.6 4.7 4.0 3.8 3.9  CL 98 97* 93* 87*  --  91* 90*  --  90*  CO2 30 30 32 38*  --  37* 42*  --  39*  GLUCOSE 145* 118* 167* 154*  --  223* 213*  --  206*  BUN 38* 29* 32* 39*  --  42* 39*  --  38*  CREATININE 0.68 0.50* 0.57* 0.58*  --  0.71 0.58*  --  0.59*  CALCIUM 8.3* 8.0* 8.8* 8.1*  --  8.2* 8.5*  --  8.4*  MG 1.7 1.9  --   --   --   --   --   --   --   PHOS 4.1 2.6   --   --   --   --   --   --   --    GFR: Estimated Creatinine Clearance: 150.2 mL/min (A) (by C-G formula based on SCr of 0.59 mg/dL (L)). Recent Labs  Lab 02/28/19 0537 03/01/19 0537 03/03/19 0528 03/04/19 0500  WBC 8.4 9.8 14.0* 16.0*    Liver Function Tests: Recent Labs  Lab 02/27/19 0427 02/28/19 0537 03/02/19 0434 03/03/19 0528 03/04/19 0500  AST 52* 44* 32 37 38  ALT 118* 106* 67* 65* 68*  ALKPHOS 73 91 83 77 76  BILITOT 0.9 0.5 0.4 0.6 0.4  PROT 5.4* 6.2* 5.7* 5.9* 6.0*  ALBUMIN 2.5* 2.7* 2.7* 2.8* 2.6*   No results for input(s): LIPASE, AMYLASE in the last 168 hours. No results for input(s): AMMONIA in the last 168 hours.  ABG    Component Value Date/Time   PHART 7.447 03/04/2019 0359  PCO2ART 61.7 (H) 03/04/2019 0359   PO2ART 45.0 (L) 03/04/2019 0359   HCO3 42.3 (H) 03/04/2019 0359   TCO2 44 (H) 03/04/2019 0359   O2SAT 80.0 03/04/2019 0359     Coagulation Profile: No results for input(s): INR, PROTIME in the last 168 hours.  Cardiac Enzymes: Recent Labs  Lab 03/02/19 0434  CKTOTAL 53    HbA1C: Hgb A1c MFr Bld  Date/Time Value Ref Range Status  02/11/2019 07:10 PM 6.7 (H) 4.8 - 5.6 % Final    Comment:    (NOTE) Pre diabetes:          5.7%-6.4% Diabetes:              >6.4% Glycemic control for   <7.0% adults with diabetes     CBG: Recent Labs  Lab 03/03/19 1612 03/03/19 2025 03/04/19 0027 03/04/19 0401 03/04/19 0735  GLUCAP 201* 203* 139* 190* 210*    This patient is critically ill with multiple organ system failure; which, requires frequent high complexity decision making, assessment, support, evaluation, and titration of therapies. This was completed through the application of advanced monitoring technologies and extensive interpretation of multiple databases. During this encounter critical care time was devoted to patient care services described in this note for 34 minutes.  Josephine IgoBradley L , DO [facility]  03/04/2019 9:21 AM

## 2019-03-05 ENCOUNTER — Inpatient Hospital Stay (HOSPITAL_COMMUNITY): Payer: HRSA Program

## 2019-03-05 LAB — COMPREHENSIVE METABOLIC PANEL
ALT: 76 U/L — ABNORMAL HIGH (ref 0–44)
AST: 49 U/L — ABNORMAL HIGH (ref 15–41)
Albumin: 2.4 g/dL — ABNORMAL LOW (ref 3.5–5.0)
Alkaline Phosphatase: 79 U/L (ref 38–126)
Anion gap: 10 (ref 5–15)
BUN: 36 mg/dL — ABNORMAL HIGH (ref 6–20)
CO2: 38 mmol/L — ABNORMAL HIGH (ref 22–32)
Calcium: 8.3 mg/dL — ABNORMAL LOW (ref 8.9–10.3)
Chloride: 91 mmol/L — ABNORMAL LOW (ref 98–111)
Creatinine, Ser: 0.6 mg/dL — ABNORMAL LOW (ref 0.61–1.24)
GFR calc Af Amer: >60 mL/min (ref >60)
GFR calc non Af Amer: >60 mL/min (ref >60)
Glucose, Bld: 117 mg/dL — ABNORMAL HIGH (ref 70–99)
Potassium: 4.1 mmol/L (ref 3.5–5.1)
Sodium: 139 mmol/L (ref 135–145)
Total Bilirubin: 0.7 mg/dL (ref 0.3–1.2)
Total Protein: 5.9 g/dL — ABNORMAL LOW (ref 6.5–8.1)

## 2019-03-05 LAB — POCT I-STAT 7, (LYTES, BLD GAS, ICA,H+H)
Acid-Base Excess: 15 mmol/L — ABNORMAL HIGH (ref 0.0–2.0)
Bicarbonate: 41.6 mmol/L — ABNORMAL HIGH (ref 20.0–28.0)
Calcium, Ion: 1.2 mmol/L (ref 1.15–1.40)
HCT: 29 % — ABNORMAL LOW (ref 39.0–52.0)
Hemoglobin: 9.9 g/dL — ABNORMAL LOW (ref 13.0–17.0)
O2 Saturation: 83 %
Patient temperature: 37.6
Potassium: 3.7 mmol/L (ref 3.5–5.1)
Sodium: 137 mmol/L (ref 135–145)
TCO2: 44 mmol/L — ABNORMAL HIGH (ref 22–32)
pCO2 arterial: 65.4 mmHg (ref 32.0–48.0)
pH, Arterial: 7.415 (ref 7.350–7.450)
pO2, Arterial: 50 mmHg — ABNORMAL LOW (ref 83.0–108.0)

## 2019-03-05 LAB — GLUCOSE, CAPILLARY
Glucose-Capillary: 124 mg/dL — ABNORMAL HIGH (ref 70–99)
Glucose-Capillary: 89 mg/dL (ref 70–99)
Glucose-Capillary: 180 mg/dL — ABNORMAL HIGH (ref 70–99)
Glucose-Capillary: 177 mg/dL — ABNORMAL HIGH (ref 70–99)
Glucose-Capillary: 149 mg/dL — ABNORMAL HIGH (ref 70–99)

## 2019-03-05 LAB — CBC
HCT: 29.6 % — ABNORMAL LOW (ref 39.0–52.0)
Hemoglobin: 9.1 g/dL — ABNORMAL LOW (ref 13.0–17.0)
MCH: 30.4 pg (ref 26.0–34.0)
MCHC: 30.7 g/dL (ref 30.0–36.0)
MCV: 99 fL (ref 80.0–100.0)
Platelets: 225 10*3/uL (ref 150–400)
RBC: 2.99 MIL/uL — ABNORMAL LOW (ref 4.22–5.81)
RDW: 17.2 % — ABNORMAL HIGH (ref 11.5–15.5)
WBC: 12.8 10*3/uL — ABNORMAL HIGH (ref 4.0–10.5)
nRBC: 0.5 % — ABNORMAL HIGH (ref 0.0–0.2)

## 2019-03-05 MED ORDER — FREE WATER
400.0000 mL | Freq: Three times a day (TID) | Status: DC
  Administered 2019-03-05 – 2019-03-07 (×6): 400 mL

## 2019-03-05 MED ORDER — DEXMEDETOMIDINE HCL IN NACL 400 MCG/100ML IV SOLN: 0.3000 ug/kg/h | INTRAVENOUS | Status: DC

## 2019-03-05 MED ORDER — FUROSEMIDE 10 MG/ML IJ SOLN
40.0000 mg | Freq: Two times a day (BID) | INTRAMUSCULAR | Status: AC
  Administered 2019-03-05 – 2019-03-06 (×2): 40 mg via INTRAVENOUS
  Filled 2019-03-05 (×2): qty 4

## 2019-03-05 NOTE — Progress Notes (Signed)
BIS is > 70 despite aggressive titration of Versed and Fentanyl. VS remain in normal range. Elink made aware.

## 2019-03-05 NOTE — Progress Notes (Addendum)
NAME:  Magda Paganininrique Jensen, MRN:  [id number], DOB:  [birth year], LOS: 20 ADMISSION DATE:  02/22/2019, CONSULTATION DATE:  12/11 REFERRING MD:  Jarvis NewcomerGrunz, CHIEF COMPLAINT:  dyspnea   Brief History   48 year old male [facility], [facility], 2019 for severe acute respiratory failure with hypoxemia secondary to COVID-19 pneumonia.  Past Medical History  None known  Significant Hospital Events   12/8 [facility]; completed CCP 12/11 12/13 intubated 12/14: Difficulty with ventilator synchrony, added propofol, continued benzodiazepine and narcotic infusions.  Placed in prone position. Actemra administered 12/15 onward, proning 12/23 worsening oxygenation, increasing d-dimer, starting full dose lovenox 12/26 worsening oxygenation again 12/29 still with much difficulty with sedation, high vent settings, 100% 30PC 12/30 overnight ETT exchange by RT  Consults:  PCCM  Procedures:  ETT 12/13 >  PICC 12/15 >   Significant Diagnostic Tests:  12/13 CTA head > Negative CTA of the head and neck. No large vessel occlusion, hemodynamically significant stenosis, or other acute vascular abnormality. 12/13 CTA chest > no definite PE was identified, but study was limited by poor contrast timing. Diffuse bilateral ground-glass airspace opacities involving all lobes. 12/27 LE doppler > neg dvt bilat (prelim)  Micro Data:  12/5 SARS COV 2 > positive 12/13 blood > NG 12/19 sputum MSSA, haemophilus 12/28 enterobacter? Colonized  12/28 Bcx X 1 bottle coag neg staph   Antimicrobials:  12/7 decadron x1 12/8 methylpred  12/7 remdesivir >  Completed 10 days 12/14 actemra x 1  12/20 ceftriaxone >> 10 days planned  Interim history/subjective:   Remains critically ill, on PCV 30, FiO2 1.00, PEEP 16. Attempted to stop paralysis 12/30, had to be restarted at 22:00 for significant vent dyssynchrony Currently on Versed 20, Dilaudid 5 I/O 3.3 L  positive  Objective   Blood pressure 129/71, pulse 99, temperature 99.5 F (37.5 C), resp. rate (!) 30, height 6' (1.829 m), weight 116 kg, SpO2 (!) 82 %.    Vent Mode: PCV FiO2 (%):  [100 %] 100 % Set Rate:  [30 bmp] 30 bmp PEEP:  [16 cmH20] 16 cmH20 Plateau Pressure:  [40 cmH20-45 cmH20] 45 cmH20   Intake/Output Summary (Last 24 hours) at 03/05/2019 1318 Last data filed at 03/05/2019 1200 Gross per 24 hour  Intake 3064.36 ml  Output 2420 ml  Net 644.36 ml   Filed Weights   03/02/19 0500 03/03/19 0448 03/04/19 0500  Weight: 116 kg 114.5 kg 116 kg    Examination:  General: Acute and chronically ill-appearing man, ventilated, sedated and paralyzed HENT: ET tube in place, no evidence of a cuff leak, oropharynx otherwise clear PULM: Coarse bilaterally, no wheezing, inspiratory crackles CV: Regular, borderline tachycardic, no murmur GI: Nondistended, positive hypoactive bowel sounds MSK: No deformities, no significant edema Neuro: Paralyzed, sedated  Resolved Hospital Problem list   Constipation   Assessment & Plan:   ARDS due to COVID 19 pneumonia: severe hypoxemia, more stable overnight, slowly decreasing sedation requirements Currently on pressure control ventilation.  [facility], [facility] if able.  Will need to maintain adequate sedation and likely paralysis in order to accomplish Goal plateau pressure <30, goal driving pressure < 15 Wean FiO2 first, goal 0.60, then adjust PEEP Cycle prone positioning Diuresis as he can tolerate, goal CVP 5-8; dosed lasix 12/31 Suspect he will require trach and prolonged support given the course thus far. Not in a position to do this now due to his FiO2  and PEEP needs.  VAP prevention order set  Need for sedation for mechanical ventilation: still has high sedation, synchrony needs Continue moderate to high-dose Versed, Dilaudid.  Should be able to wean the Versed some, currently at 20 Continue paralysis until  improvement in gas exchange Continue oral sedatives with oxycodone plus clonazepam  Elevated LFT: Improved  Hypertension Carvedilol Labetalol, hydralazine if needed  Staph pneumonia (MSSA, Haemophilus, Enterobacter cloaca) -Completed 10 days ceftriaxone, last dose 03/05/2019 Plan to check respiratory, blood, urine cultures and reinitiate broad-spectrum antibiotics if any evidence of decline or spikes fever  Hypokalemia, currently improved Replace electrolytes if needed  Hyperglycemia Sliding-scale insulin  Hypernatremia: Improved    Best practice:  Diet: tube feeding (on hold 12/24) Pain/Anxiety/Delirium protocol (if indicated): yes VAP protocol (if indicated): yes DVT prophylaxis: full dose lovenox GI prophylaxis: famotidine Glucose control: ssi Mobility: bed rest Code Status: full Family Communication: Spoke with pt's son 12/31 to update Disposition: ICU  Labs   CBC: Recent Labs  Lab 02/27/19 0427 02/28/19 0537 03/01/19 0537 03/03/19 0528 03/04/19 0359 03/04/19 0500 03/05/19 0415 03/05/19 1126  WBC 9.4 8.4 9.8 14.0*  --  16.0* 12.8*  --   NEUTROABS 7.3 6.8 8.0*  --   --   --   --   --   HGB 10.9* 11.9* 11.1* 10.1* 10.2* 9.9* 9.1* 9.9*  HCT 33.7* 36.9* 35.9* 32.2* 30.0* 31.8* 29.6* 29.0*  MCV 93.9 94.4 99.2 98.2  --  98.1 99.0  --   PLT 142* 150 156 196  --  204 225  --     Basic Metabolic Panel: Recent Labs  Lab 02/27/19 0427 02/28/19 0524 03/01/19 0537 03/02/19 0434 03/03/19 0528 03/04/19 0359 03/04/19 0500 03/05/19 0415 03/05/19 1126  NA 136  --  139 139 138 136 137 139 137  K 3.2*  --  4.1 4.7 4.0 3.8 3.9 4.1 3.7  CL 97*   < > 87* 91* 90*  --  90* 91*  --   CO2 30   < > 38* 37* 42*  --  39* 38*  --   GLUCOSE 118*   < > 154* 223* 213*  --  206* 117*  --   BUN 29*   < > 39* 42* 39*  --  38* 36*  --   CREATININE 0.50*   < > 0.58* 0.71 0.58*  --  0.59* 0.60*  --   CALCIUM 8.0*   < > 8.1* 8.2* 8.5*  --  8.4* 8.3*  --   MG 1.9  --   --   --    --   --   --   --   --   PHOS 2.6  --   --   --   --   --   --   --   --    < > = values in this interval not displayed.   GFR: Estimated Creatinine Clearance: 150.2 mL/min (A) (by C-G formula based on SCr of 0.6 mg/dL (L)). Recent Labs  Lab 03/01/19 0537 03/03/19 0528 03/04/19 0500 03/05/19 0415  WBC 9.8 14.0* 16.0* 12.8*    Liver Function Tests: Recent Labs  Lab 02/28/19 0537 03/02/19 0434 03/03/19 0528 03/04/19 0500 03/05/19 0415  AST 44* 32 37 38 49*  ALT 106* 67* 65* 68* 76*  ALKPHOS 91 83 77 76 79  BILITOT 0.5 0.4 0.6 0.4 0.7  PROT 6.2* 5.7* 5.9* 6.0* 5.9*  ALBUMIN 2.7* 2.7* 2.8* 2.6* 2.4*   No  results for input(s): LIPASE, AMYLASE in the last 168 hours. No results for input(s): AMMONIA in the last 168 hours.  ABG    Component Value Date/Time   PHART 7.415 03/05/2019 1126   PCO2ART 65.4 (HH) 03/05/2019 1126   PO2ART 50.0 (L) 03/05/2019 1126   HCO3 41.6 (H) 03/05/2019 1126   TCO2 44 (H) 03/05/2019 1126   ACIDBASEDEF 5.0 (H) 03/02/2019 0224   O2SAT 83.0 03/05/2019 1126     Coagulation Profile: No results for input(s): INR, PROTIME in the last 168 hours.  Cardiac Enzymes: Recent Labs  Lab 03/02/19 0434  CKTOTAL 53    HbA1C: Hgb A1c MFr Bld  Date/Time Value Ref Range Status  02/28/2019 07:10 PM 6.7 (H) 4.8 - 5.6 % Final    Comment:    (NOTE) Pre diabetes:          5.7%-6.4% Diabetes:              >6.4% Glycemic control for   <7.0% adults with diabetes     CBG: Recent Labs  Lab 03/04/19 1952 03/04/19 2347 03/05/19 0350 03/05/19 0805 03/05/19 1123  GLUCAP 200* 167* 124* 89 180*    This patient is critically ill with multiple organ system failure; which, requires frequent high complexity decision making, assessment, support, evaluation, and titration of therapies. This was completed through the application of advanced monitoring technologies and extensive interpretation of multiple databases. During this encounter critical care time was  devoted to patient care services described in this note for 34 minutes.  Levy Pupa, MD, PhD 03/05/2019, 1:39 PM [facility] [phone] or if no answer [phone]

## 2019-03-05 NOTE — Progress Notes (Signed)
eLink Physician-Brief Progress Note Patient Name: Tiler Brandis DOB: 07/31/1970 MRN: [id number]   Date of Service  03/05/2019  HPI/Events of Note  BIS 77  eICU Interventions  Precedex at a fixed dose of 0.3 mcg added to optimize sedation        Frederik Pear 03/05/2019, 6:53 AM

## 2019-03-06 ENCOUNTER — Inpatient Hospital Stay (HOSPITAL_COMMUNITY): Payer: HRSA Program

## 2019-03-06 LAB — COMPREHENSIVE METABOLIC PANEL
ALT: 104 U/L — ABNORMAL HIGH (ref 0–44)
AST: 74 U/L — ABNORMAL HIGH (ref 15–41)
Albumin: 2.5 g/dL — ABNORMAL LOW (ref 3.5–5.0)
Alkaline Phosphatase: 88 U/L (ref 38–126)
Anion gap: 11 (ref 5–15)
BUN: 38 mg/dL — ABNORMAL HIGH (ref 6–20)
CO2: 39 mmol/L — ABNORMAL HIGH (ref 22–32)
Calcium: 8.6 mg/dL — ABNORMAL LOW (ref 8.9–10.3)
Chloride: 90 mmol/L — ABNORMAL LOW (ref 98–111)
Creatinine, Ser: 0.6 mg/dL — ABNORMAL LOW (ref 0.61–1.24)
GFR calc Af Amer: >60 mL/min (ref >60)
GFR calc non Af Amer: >60 mL/min (ref >60)
Glucose, Bld: 165 mg/dL — ABNORMAL HIGH (ref 70–99)
Potassium: 4 mmol/L (ref 3.5–5.1)
Sodium: 140 mmol/L (ref 135–145)
Total Bilirubin: 0.8 mg/dL (ref 0.3–1.2)
Total Protein: 6.4 g/dL — ABNORMAL LOW (ref 6.5–8.1)

## 2019-03-06 LAB — CBC
HCT: 31.1 % — ABNORMAL LOW (ref 39.0–52.0)
Hemoglobin: 9.6 g/dL — ABNORMAL LOW (ref 13.0–17.0)
MCH: 31.3 pg (ref 26.0–34.0)
MCHC: 30.9 g/dL (ref 30.0–36.0)
MCV: 101.3 fL — ABNORMAL HIGH (ref 80.0–100.0)
Platelets: 251 10*3/uL (ref 150–400)
RBC: 3.07 MIL/uL — ABNORMAL LOW (ref 4.22–5.81)
RDW: 17.6 % — ABNORMAL HIGH (ref 11.5–15.5)
WBC: 10.3 10*3/uL (ref 4.0–10.5)
nRBC: 0.5 % — ABNORMAL HIGH (ref 0.0–0.2)

## 2019-03-06 LAB — POCT I-STAT 7, (LYTES, BLD GAS, ICA,H+H)
Acid-Base Excess: 15 mmol/L — ABNORMAL HIGH (ref 0.0–2.0)
Bicarbonate: 43.2 mmol/L — ABNORMAL HIGH (ref 20.0–28.0)
Calcium, Ion: 1.16 mmol/L (ref 1.15–1.40)
HCT: 31 % — ABNORMAL LOW (ref 39.0–52.0)
Hemoglobin: 10.5 g/dL — ABNORMAL LOW (ref 13.0–17.0)
O2 Saturation: 84 %
Patient temperature: 37.7
Potassium: 4 mmol/L (ref 3.5–5.1)
Sodium: 137 mmol/L (ref 135–145)
TCO2: 45 mmol/L — ABNORMAL HIGH (ref 22–32)
pCO2 arterial: 74.1 mmHg (ref 32.0–48.0)
pH, Arterial: 7.377 (ref 7.350–7.450)
pO2, Arterial: 55 mmHg — ABNORMAL LOW (ref 83.0–108.0)

## 2019-03-06 LAB — GLUCOSE, CAPILLARY
Glucose-Capillary: 119 mg/dL — ABNORMAL HIGH (ref 70–99)
Glucose-Capillary: 142 mg/dL — ABNORMAL HIGH (ref 70–99)
Glucose-Capillary: 142 mg/dL — ABNORMAL HIGH (ref 70–99)
Glucose-Capillary: 155 mg/dL — ABNORMAL HIGH (ref 70–99)
Glucose-Capillary: 165 mg/dL — ABNORMAL HIGH (ref 70–99)
Glucose-Capillary: 171 mg/dL — ABNORMAL HIGH (ref 70–99)
Glucose-Capillary: 177 mg/dL — ABNORMAL HIGH (ref 70–99)

## 2019-03-06 MED ORDER — FUROSEMIDE 10 MG/ML IJ SOLN
40.0000 mg | Freq: Two times a day (BID) | INTRAMUSCULAR | Status: AC
  Administered 2019-03-06 (×2): 40 mg via INTRAVENOUS
  Filled 2019-03-06 (×2): qty 4

## 2019-03-06 NOTE — Plan of Care (Signed)
Temp went up during the shift/PM shift. Ice packs to arm pits to prevent progression.   Pt remains on paralytic agent as well sedation: versed/dilaudid.    Problem: Clinical Measurements: Goal: Will remain free from infection Outcome: Not Progressing Goal: Respiratory complications will improve Outcome: Not Progressing   Problem: Activity: Goal: Risk for activity intolerance will decrease Outcome: Not Progressing

## 2019-03-06 NOTE — Progress Notes (Signed)
NAME:  Walter Oliver, MRN:  [id number], DOB:  1970/12/02, LOS: 21 ADMISSION DATE:  Feb 23, 2019, CONSULTATION DATE:  12/11 REFERRING MD:  Bonner Puna, CHIEF COMPLAINT:  dyspnea   Brief History   49 year old male admitted on 12/8 to [facility], then moved to [facility] Feb 23, 2019 for severe acute respiratory failure with hypoxemia secondary to COVID-19 pneumonia.  Past Medical History  None known  Significant Hospital Events   12/8 admission 12/11 move to [facility]; completed CCP 12/11 12/13 intubated 12/14: Difficulty with ventilator synchrony, added propofol, continued benzodiazepine and narcotic infusions.  Placed in prone position. Actemra administered 12/15 onward, proning 12/23 worsening oxygenation, increasing d-dimer, starting full dose lovenox 12/26 worsening oxygenation again 12/29 still with much difficulty with sedation, high vent settings, 100% 30PC 12/30 overnight ETT exchange by RT  Consults:  PCCM  Procedures:  ETT 12/13 >  PICC 12/15 >   Significant Diagnostic Tests:  12/13 CTA head > Negative CTA of the head and neck. No large vessel occlusion, hemodynamically significant stenosis, or other acute vascular abnormality. 12/13 CTA chest > no definite PE was identified, but study was limited by poor contrast timing. Diffuse bilateral ground-glass airspace opacities involving all lobes. 12/27 LE doppler > neg dvt bilat (prelim)  Micro Data:  12/5 SARS COV 2 > positive 12/13 blood > NG 12/19 sputum MSSA, haemophilus 12/28 enterobacter? Colonized  12/28 Bcx X 1 bottle coag neg staph   Antimicrobials:  12/7 decadron x1 12/8 methylpred  12/7 remdesivir >  Completed 10 days 12/14 actemra x 1 12/20 ceftriaxone >> 10 days planned  Interim history/subjective:  Remains on PCV 30, PEEP 16, FiO2 0.90 Paralyzed and deeply sedated Cyclic proning Did diurese about 500 cc, now net +2.5 L Low-grade temperature 100.4  Objective   Blood pressure 124/76, pulse (!) 114, temperature  (!) 100.4 F (38 C), temperature source Esophageal, resp. rate (!) 34, height 6' (1.829 m), weight 116 kg, SpO2 (!) 88 %.    Vent Mode: PCV FiO2 (%):  [90 %-100 %] 90 % Set Rate:  [30 bmp] 30 bmp PEEP:  [16 cmH20] 16 cmH20 Plateau Pressure:  [44 cmH20-46 cmH20] 44 cmH20   Intake/Output Summary (Last 24 hours) at 03/06/2019 0834 Last data filed at 03/06/2019 0800 Gross per 24 hour  Intake 2753.29 ml  Output 3440 ml  Net -686.71 ml   Filed Weights   03/02/19 0500 03/03/19 0448 03/04/19 0500  Weight: 116 kg 114.5 kg 116 kg    Examination:  General: Acute and chronically ill-appearing man, ventilated, sedated and paralyzed HENT: ET tube in place, no evidence of a cuff leak, oropharynx otherwise clear PULM: Coarse bilaterally, no wheezing, inspiratory crackles CV: Regular, borderline tachycardic, no murmur GI: Nondistended, positive hypoactive bowel sounds MSK: No deformities, no significant edema Neuro: Paralyzed, sedated  Resolved Hospital Problem list   Constipation   Assessment & Plan:   ARDS due to COVID 19 pneumonia: severe hypoxemia, more stable overnight, slowly decreasing sedation requirements Remains on pressure control ventilation.  Will try to transition him to [facility] 6 cc/kg on 1/1.  It looks like his ventilation is adequate. Continue to wean PEEP, FiO2 as able Goal plateau pressure less than 30, driving pressure less than 15 Continue to cycle prone positioning Diuresis as he can tolerate, repeat Lasix dosing on 1/1.  Goal CVP 5-8 Suspect he will require tracheostomy and prolonged support but I need his PEEP and FiO2 needs to be improved in order to tolerate VAP prevention order set Remdesivir, Solu-Medrol  completed  Need for sedation for mechanical ventilation: still has high sedation, synchrony needs Continue Dilaudid, Versed Continue paralysis until improved gas exchange Oral sedatives with oxycodone and clonazepam  Elevated LFT: Improved Follow LFT  intermittently  Hypertension Continue carvedilol Labetalol, hydralazine if needed  Staph pneumonia (MSSA, Haemophilus, Enterobacter cloaca) -Completed 10 days ceftriaxone, last dose 03/05/2019 Plan to check respiratory, blood, urine cultures and reinitiate broad-spectrum antibiotics if any evidence of decline or spikes fever  Hypokalemia, currently improved Replace electrolytes if needed  Hyperglycemia SSI as ordered  Hypernatremia: Improved Follow BMP   Best practice:  Diet: tube feeding (on hold 12/24) Pain/Anxiety/Delirium protocol (if indicated): yes VAP protocol (if indicated): yes DVT prophylaxis: full dose lovenox GI prophylaxis: famotidine Glucose control: ssi Mobility: bed rest Code Status: full Family Communication: Spoke with pt's son on 1/1 to update Disposition: ICU  Labs   CBC: Recent Labs  Lab 02/28/19 0537 03/01/19 0537 03/03/19 0528 03/04/19 0359 03/04/19 0500 03/05/19 0415 03/05/19 1126 03/06/19 0455  WBC 8.4 9.8 14.0*  --  16.0* 12.8*  --  10.3  NEUTROABS 6.8 8.0*  --   --   --   --   --   --   HGB 11.9* 11.1* 10.1* 10.2* 9.9* 9.1* 9.9* 9.6*  HCT 36.9* 35.9* 32.2* 30.0* 31.8* 29.6* 29.0* 31.1*  MCV 94.4 99.2 98.2  --  98.1 99.0  --  101.3*  PLT 150 156 196  --  204 225  --  251    Basic Metabolic Panel: Recent Labs  Lab 03/02/19 0434 03/03/19 0528 03/04/19 0359 03/04/19 0500 03/05/19 0415 03/05/19 1126 03/06/19 0455  NA 139 138 136 137 139 137 140  K 4.7 4.0 3.8 3.9 4.1 3.7 4.0  CL 91* 90*  --  90* 91*  --  90*  CO2 37* 42*  --  39* 38*  --  39*  GLUCOSE 223* 213*  --  206* 117*  --  165*  BUN 42* 39*  --  38* 36*  --  38*  CREATININE 0.71 0.58*  --  0.59* 0.60*  --  0.60*  CALCIUM 8.2* 8.5*  --  8.4* 8.3*  --  8.6*   GFR: Estimated Creatinine Clearance: 150.2 mL/min (A) (by C-G formula based on SCr of 0.6 mg/dL (L)). Recent Labs  Lab 03/03/19 0528 03/04/19 0500 03/05/19 0415 03/06/19 0455  WBC 14.0* 16.0* 12.8* 10.3     Liver Function Tests: Recent Labs  Lab 03/02/19 0434 03/03/19 0528 03/04/19 0500 03/05/19 0415 03/06/19 0455  AST 32 37 38 49* 74*  ALT 67* 65* 68* 76* 104*  ALKPHOS 83 77 76 79 88  BILITOT 0.4 0.6 0.4 0.7 0.8  PROT 5.7* 5.9* 6.0* 5.9* 6.4*  ALBUMIN 2.7* 2.8* 2.6* 2.4* 2.5*   No results for input(s): LIPASE, AMYLASE in the last 168 hours. No results for input(s): AMMONIA in the last 168 hours.  ABG    Component Value Date/Time   PHART 7.415 03/05/2019 1126   PCO2ART 65.4 (HH) 03/05/2019 1126   PO2ART 50.0 (L) 03/05/2019 1126   HCO3 41.6 (H) 03/05/2019 1126   TCO2 44 (H) 03/05/2019 1126   ACIDBASEDEF 5.0 (H) 03/02/2019 0224   O2SAT 83.0 03/05/2019 1126     Coagulation Profile: No results for input(s): INR, PROTIME in the last 168 hours.  Cardiac Enzymes: Recent Labs  Lab 03/02/19 0434  CKTOTAL 53    HbA1C: Hgb A1c MFr Bld  Date/Time Value Ref Range Status  02/28/2019 07:10 PM  6.7 (H) 4.8 - 5.6 % Final    Comment:    (NOTE) Pre diabetes:          5.7%-6.4% Diabetes:              >6.4% Glycemic control for   <7.0% adults with diabetes     CBG: Recent Labs  Lab 03/05/19 1620 03/05/19 2106 03/06/19 0016 03/06/19 0500 03/06/19 0755  GLUCAP 177* 149* 142* 155* 119*    This patient is critically ill with multiple organ system failure; which, requires frequent high complexity decision making, assessment, support, evaluation, and titration of therapies. This was completed through the application of advanced monitoring technologies and extensive interpretation of multiple databases. During this encounter critical care time was devoted to patient care services described in this note for 35 minutes.  Levy Pupa, MD, PhD 03/06/2019, 8:34 AM [facility] [phone] or if no answer [phone]

## 2019-03-06 NOTE — Progress Notes (Signed)
ABG results given to Dr. Delton Coombes. Verbal order received to prone pt. RN notified. RT will continue to monitor.

## 2019-03-06 NOTE — Progress Notes (Signed)
Recruitment maneuver performed per CCM MD request. Pt tolerated well. RT will continue to monitor.

## 2019-03-06 DEATH — deceased

## 2019-03-07 ENCOUNTER — Inpatient Hospital Stay (HOSPITAL_COMMUNITY): Payer: HRSA Program

## 2019-03-07 LAB — BASIC METABOLIC PANEL
Anion gap: 11 (ref 5–15)
BUN: 41 mg/dL — ABNORMAL HIGH (ref 6–20)
CO2: 40 mmol/L — ABNORMAL HIGH (ref 22–32)
Calcium: 8.5 mg/dL — ABNORMAL LOW (ref 8.9–10.3)
Chloride: 89 mmol/L — ABNORMAL LOW (ref 98–111)
Creatinine, Ser: 0.6 mg/dL — ABNORMAL LOW (ref 0.61–1.24)
GFR calc Af Amer: >60 mL/min (ref >60)
GFR calc non Af Amer: >60 mL/min (ref >60)
Glucose, Bld: 163 mg/dL — ABNORMAL HIGH (ref 70–99)
Potassium: 4.3 mmol/L (ref 3.5–5.1)
Sodium: 140 mmol/L (ref 135–145)

## 2019-03-07 LAB — CULTURE, RESPIRATORY W GRAM STAIN

## 2019-03-07 LAB — POCT I-STAT 7, (LYTES, BLD GAS, ICA,H+H)
Acid-Base Excess: 15 mmol/L — ABNORMAL HIGH (ref 0.0–2.0)
Bicarbonate: 43.7 mmol/L — ABNORMAL HIGH (ref 20.0–28.0)
Calcium, Ion: 1.18 mmol/L (ref 1.15–1.40)
HCT: 29 % — ABNORMAL LOW (ref 39.0–52.0)
Hemoglobin: 9.9 g/dL — ABNORMAL LOW (ref 13.0–17.0)
O2 Saturation: 79 %
Patient temperature: 99.1
Potassium: 4.2 mmol/L (ref 3.5–5.1)
Sodium: 138 mmol/L (ref 135–145)
TCO2: 46 mmol/L — ABNORMAL HIGH (ref 22–32)
pCO2 arterial: 79.1 mmHg (ref 32.0–48.0)
pH, Arterial: 7.351 (ref 7.350–7.450)
pO2, Arterial: 49 mmHg — ABNORMAL LOW (ref 83.0–108.0)

## 2019-03-07 LAB — CBC
HCT: 32.3 % — ABNORMAL LOW (ref 39.0–52.0)
Hemoglobin: 9.7 g/dL — ABNORMAL LOW (ref 13.0–17.0)
MCH: 30.5 pg (ref 26.0–34.0)
MCHC: 30 g/dL (ref 30.0–36.0)
MCV: 101.6 fL — ABNORMAL HIGH (ref 80.0–100.0)
Platelets: 324 10*3/uL (ref 150–400)
RBC: 3.18 MIL/uL — ABNORMAL LOW (ref 4.22–5.81)
RDW: 17.5 % — ABNORMAL HIGH (ref 11.5–15.5)
WBC: 10.1 10*3/uL (ref 4.0–10.5)
nRBC: 1 % — ABNORMAL HIGH (ref 0.0–0.2)

## 2019-03-07 LAB — GLUCOSE, CAPILLARY
Glucose-Capillary: 155 mg/dL — ABNORMAL HIGH (ref 70–99)
Glucose-Capillary: 134 mg/dL — ABNORMAL HIGH (ref 70–99)
Glucose-Capillary: 206 mg/dL — ABNORMAL HIGH (ref 70–99)
Glucose-Capillary: 167 mg/dL — ABNORMAL HIGH (ref 70–99)
Glucose-Capillary: 191 mg/dL — ABNORMAL HIGH (ref 70–99)

## 2019-03-07 LAB — CULTURE, BLOOD (ROUTINE X 2)
Culture: NO GROWTH
Special Requests: ADEQUATE

## 2019-03-07 LAB — PHOSPHORUS: Phosphorus: 3.8 mg/dL (ref 2.5–4.6)

## 2019-03-07 LAB — MAGNESIUM: Magnesium: 2.2 mg/dL (ref 1.7–2.4)

## 2019-03-07 MED ORDER — STERILE WATER FOR INJECTION IV SOLN
INTRAVENOUS | Status: DC
  Filled 2019-03-07: qty 850

## 2019-03-07 MED ORDER — SODIUM POLYSTYRENE SULFONATE 15 GM/60ML PO SUSP: 30.0000 g | Freq: Once | ORAL | Status: DC

## 2019-03-07 MED ORDER — DEXTROSE 50 % IV SOLN: 1.0000 | Freq: Once | INTRAVENOUS | Status: DC

## 2019-03-07 MED ORDER — EPINEPHRINE 1 MG/10ML IJ SOSY
PREFILLED_SYRINGE | INTRAMUSCULAR | Status: AC
  Filled 2019-03-07: qty 30

## 2019-03-07 MED ORDER — ACETAZOLAMIDE SODIUM 500 MG IJ SOLR
250.0000 mg | Freq: Two times a day (BID) | INTRAMUSCULAR | Status: DC
  Administered 2019-03-07: 09:00:00 250 mg via INTRAVENOUS
  Filled 2019-03-07 (×2): qty 500

## 2019-03-07 MED ORDER — SODIUM BICARBONATE 8.4 % IV SOLN: 100.0000 meq | Freq: Once | INTRAVENOUS | Status: DC

## 2019-03-07 MED ORDER — NOREPINEPHRINE 4 MG/250ML-% IV SOLN
0.0000 ug/min | INTRAVENOUS | Status: DC
  Filled 2019-03-07: qty 250

## 2019-03-07 MED ORDER — EPINEPHRINE HCL 5 MG/250ML IV SOLN IN NS: 0.5000 ug/min | INTRAVENOUS | Status: DC

## 2019-03-07 MED ORDER — DOPAMINE-DEXTROSE 3.2-5 MG/ML-% IV SOLN
0.0000 ug/kg/min | INTRAVENOUS | Status: DC
  Filled 2019-03-07: qty 250

## 2019-03-07 MED ORDER — STERILE WATER FOR INJECTION IJ SOLN
INTRAMUSCULAR | Status: AC
  Filled 2019-03-07: qty 10

## 2019-03-07 MED ORDER — CALCIUM GLUCONATE-NACL 1-0.675 GM/50ML-% IV SOLN: 1.0000 g | Freq: Once | INTRAVENOUS | Status: DC

## 2019-03-07 MED ORDER — SODIUM BICARBONATE 8.4 % IV SOLN
INTRAVENOUS | Status: AC
  Filled 2019-03-07: qty 150

## 2019-03-07 MED ORDER — ATROPINE SULFATE 1 MG/ML IJ SOLN
1.0000 mg | Freq: Once | INTRAMUSCULAR | Status: DC
  Filled 2019-03-07: qty 1

## 2019-03-07 MED ORDER — INSULIN ASPART 100 UNIT/ML IV SOLN: 10.0000 [IU] | Freq: Once | INTRAVENOUS | Status: DC

## 2019-03-07 MED ORDER — FUROSEMIDE 10 MG/ML IJ SOLN
40.0000 mg | Freq: Two times a day (BID) | INTRAMUSCULAR | Status: DC
  Administered 2019-03-07: 40 mg via INTRAVENOUS
  Filled 2019-03-07 (×2): qty 4

## 2019-03-08 ENCOUNTER — Ambulatory Visit (HOSPITAL_COMMUNITY): Payer: Self-pay

## 2019-03-08 LAB — POCT I-STAT 7, (LYTES, BLD GAS, ICA,H+H)
Acid-Base Excess: 5 mmol/L — ABNORMAL HIGH (ref 0.0–2.0)
Bicarbonate: 35.8 mmol/L — ABNORMAL HIGH (ref 20.0–28.0)
Calcium, Ion: 1.21 mmol/L (ref 1.15–1.40)
HCT: 32 % — ABNORMAL LOW (ref 39.0–52.0)
Hemoglobin: 10.9 g/dL — ABNORMAL LOW (ref 13.0–17.0)
O2 Saturation: 38 %
Patient temperature: 98.6
Potassium: 6.4 mmol/L (ref 3.5–5.1)
Sodium: 135 mmol/L (ref 135–145)
TCO2: 39 mmol/L — ABNORMAL HIGH (ref 22–32)
pCO2 arterial: 94.1 mmHg (ref 32.0–48.0)
pH, Arterial: 7.189 — CL (ref 7.350–7.450)
pO2, Arterial: 29 mmHg — CL (ref 83.0–108.0)

## 2019-03-08 MED FILL — Medication: Qty: 1 | Status: AC

## 2019-03-09 LAB — URINE CULTURE: Culture: NO GROWTH

## 2019-03-11 LAB — CULTURE, RESPIRATORY W GRAM STAIN

## 2019-03-12 LAB — CULTURE, BLOOD (ROUTINE X 2)
Culture: NO GROWTH
Culture: NO GROWTH
Special Requests: ADEQUATE
Special Requests: ADEQUATE

## 2019-04-06 NOTE — Progress Notes (Signed)
Pt expired 2056 w/ Dr. Onalee Hua at bedside. Family made aware via eLink video call.   IV hydromorphone gtt wasted w/ Ander Purpura RN and Clare Gandy RN

## 2019-04-06 NOTE — Progress Notes (Signed)
NAME:  Walter Oliver, MRN:  [id number], DOB:  08-Jul-1970, LOS: 64 ADMISSION DATE:  March 08, 2019, CONSULTATION DATE:  12/11 REFERRING MD:  Bonner Puna, CHIEF COMPLAINT:  dyspnea   Brief History   49 year old male admitted on 12/8 to [facility], then moved to [facility] March 08, 2019 for severe acute respiratory failure with hypoxemia secondary to COVID-19 pneumonia.  Past Medical History  None known  Significant Hospital Events   12/8 admission 12/11 move to [facility]; completed CCP 12/11 12/13 intubated 12/14: Difficulty with ventilator synchrony, added propofol, continued benzodiazepine and narcotic infusions.  Placed in prone position. Actemra administered 12/15 onward, proning 12/23 worsening oxygenation, increasing d-dimer, starting full dose lovenox 12/26 worsening oxygenation again 12/29 still with much difficulty with sedation, high vent settings, 100% 30PC 12/30 overnight ETT exchange by RT  Consults:  PCCM  Procedures:  ETT 12/13 >  PICC 12/15 >   Significant Diagnostic Tests:  12/13 CTA head > Negative CTA of the head and neck. No large vessel occlusion, hemodynamically significant stenosis, or other acute vascular abnormality. 12/13 CTA chest > no definite PE was identified, but study was limited by poor contrast timing. Diffuse bilateral ground-glass airspace opacities involving all lobes. 12/27 LE doppler > neg dvt bilat (prelim)  Micro Data:  12/5 SARS COV 2 > positive 12/13 blood > NG 12/19 sputum MSSA, haemophilus 12/28 enterobacter? Colonized  12/28 Bcx X 1 bottle coag neg staph  1/2 blood culture >>  1/2 respiratory culture >>  1/2 urine culture >>   Antimicrobials:  12/7 decadron x1 12/8 methylpred  12/7 remdesivir >  Completed 10 days 12/14 actemra x 1 12/20 ceftriaxone >> 10 days planned  Interim history/subjective:  Transitioned him back to [facility] on 1/1.  Currently 1.00, PEEP 16 Continues to require paralysis Cycling proning I/O+ 329 cc total after diuresis  last 2 days Low-grade fever   Objective   Blood pressure 113/71, pulse (!) 107, temperature 100 F (37.8 C), resp. rate (!) 30, height 6' (1.829 m), weight 116 kg, SpO2 96 %.    Vent Mode: PRVC FiO2 (%):  [100 %] 100 % Set Rate:  [30 bmp] 30 bmp Vt Set:  [540 mL] 540 mL PEEP:  [16 cmH20] 16 cmH20 Plateau Pressure:  [44 cmH20-48 cmH20] 48 cmH20   Intake/Output Summary (Last 24 hours) at 03/28/2019 0847 Last data filed at 04/05/2019 0800 Gross per 24 hour  Intake 997.14 ml  Output 3200 ml  Net -2202.86 ml   Filed Weights   03/02/19 0500 03/03/19 0448 03/04/19 0500  Weight: 116 kg 114.5 kg 116 kg    Examination:  General: Acute and chronically ill-appearing man, ventilated, sedated and paralyzed HENT: ET tube in place, no evidence of a cuff leak, oropharynx otherwise clear PULM: Coarse bilaterally, no wheezing, inspiratory crackles CV: Regular, borderline tachycardic, no murmur GI: Nondistended, positive hypoactive bowel sounds MSK: No deformities, no significant edema Neuro: Paralyzed, sedated  Resolved Hospital Problem list   Constipation   Assessment & Plan:   ARDS due to COVID 19 pneumonia: severe hypoxemia, more stable overnight, slowly decreasing sedation requirements Transition to PRVC 1 1/1, appears to be tolerating. Continues to require deep sedation, paralytics Cycling prone positioning Wean PEEP and FiO2 as able Continue diuresis as he can tolerate, goal CVP 5-8.  Has done so for the last 2 days.  Repeat Lasix dose on 1/2.  Add 2 doses of Diamox given his evolving metabolic alkalosis VAP prevention order set Remdesivir and Solu-Medrol completed Suspect he will require  tracheostomy.  He is not stable to do so yet, needs to wean his PEEP and FiO2  Need for sedation for mechanical ventilation: still has high sedation, synchrony needs Dilaudid, midazolam for deep sedation Continue paralysis and hope to lighten as gas exchange improves Clonazepam, oxycodone as  ordered  Elevated LFT: Improved Follow LFT intermittently  Hypertension Continue carvedilol Labetalol, hydralazine if needed  Staph pneumonia (MSSA, Haemophilus, Enterobacter cloaca) -Completed 10 days ceftriaxone, last dose 03/05/2019 Repeat his respiratory, blood, urine cultures on 1/2.  Low threshold to add back antibiotics based on his failure to improve his gas exchange, low-grade fevers  Hypokalemia, currently improved Replace electrolytes if needed  Hyperglycemia Sliding-scale insulin as ordered  Hypernatremia: Improved Follow BMP on free water 400 cc every 8 hours   Best practice:  Diet: tube feeding  Pain/Anxiety/Delirium protocol (if indicated): yes VAP protocol (if indicated): yes DVT prophylaxis: full dose lovenox GI prophylaxis: famotidine Glucose control: ssi Mobility: bed rest Code Status: full Family Communication: Spoke with pt's son on 1/1 to update Disposition: ICU  Labs   CBC: Recent Labs  Lab 03/01/19 0537 03/03/19 0528 03/04/19 0500 03/05/19 0415 03/05/19 1126 03/06/19 0455 03/06/19 1440 03-24-2019 0428  WBC 9.8 14.0* 16.0* 12.8*  --  10.3  --  10.1  NEUTROABS 8.0*  --   --   --   --   --   --   --   HGB 11.1* 10.1* 9.9* 9.1* 9.9* 9.6* 10.5* 9.7*  HCT 35.9* 32.2* 31.8* 29.6* 29.0* 31.1* 31.0* 32.3*  MCV 99.2 98.2 98.1 99.0  --  101.3*  --  101.6*  PLT 156 196 204 225  --  251  --  324    Basic Metabolic Panel: Recent Labs  Lab 03/03/19 0528 03/04/19 0500 03/05/19 0415 03/05/19 1126 03/06/19 0455 03/06/19 1440 Mar 24, 2019 0428  NA 138 137 139 137 140 137 140  K 4.0 3.9 4.1 3.7 4.0 4.0 4.3  CL 90* 90* 91*  --  90*  --  89*  CO2 42* 39* 38*  --  39*  --  40*  GLUCOSE 213* 206* 117*  --  165*  --  163*  BUN 39* 38* 36*  --  38*  --  41*  CREATININE 0.58* 0.59* 0.60*  --  0.60*  --  0.60*  CALCIUM 8.5* 8.4* 8.3*  --  8.6*  --  8.5*  MG  --   --   --   --   --   --  2.2  PHOS  --   --   --   --   --   --  3.8   GFR: Estimated  Creatinine Clearance: 150.2 mL/min (A) (by C-G formula based on SCr of 0.6 mg/dL (L)). Recent Labs  Lab 03/04/19 0500 03/05/19 0415 03/06/19 0455 03-24-2019 0428  WBC 16.0* 12.8* 10.3 10.1    Liver Function Tests: Recent Labs  Lab 03/02/19 0434 03/03/19 0528 03/04/19 0500 03/05/19 0415 03/06/19 0455  AST 32 37 38 49* 74*  ALT 67* 65* 68* 76* 104*  ALKPHOS 83 77 76 79 88  BILITOT 0.4 0.6 0.4 0.7 0.8  PROT 5.7* 5.9* 6.0* 5.9* 6.4*  ALBUMIN 2.7* 2.8* 2.6* 2.4* 2.5*   No results for input(s): LIPASE, AMYLASE in the last 168 hours. No results for input(s): AMMONIA in the last 168 hours.  ABG    Component Value Date/Time   PHART 7.377 03/06/2019 1440   PCO2ART 74.1 (HH) 03/06/2019 1440   PO2ART  55.0 (L) 03/06/2019 1440   HCO3 43.2 (H) 03/06/2019 1440   TCO2 45 (H) 03/06/2019 1440   ACIDBASEDEF 5.0 (H) 03/02/2019 0224   O2SAT 84.0 03/06/2019 1440     Coagulation Profile: No results for input(s): INR, PROTIME in the last 168 hours.  Cardiac Enzymes: Recent Labs  Lab 03/02/19 0434  CKTOTAL 53    HbA1C: Hgb A1c MFr Bld  Date/Time Value Ref Range Status  02/15/2019 07:10 PM 6.7 (H) 4.8 - 5.6 % Final    Comment:    (NOTE) Pre diabetes:          5.7%-6.4% Diabetes:              >6.4% Glycemic control for   <7.0% adults with diabetes     CBG: Recent Labs  Lab 03/06/19 1657 03/06/19 2036 03/06/19 2318 March 09, 2019 0309 03/09/2019 0748  GLUCAP 142* 165* 171* 155* 134*    This patient is critically ill with multiple organ system failure; which, requires frequent high complexity decision making, assessment, support, evaluation, and titration of therapies. This was completed through the application of advanced monitoring technologies and extensive interpretation of multiple databases. During this encounter critical care time was devoted to patient care services described in this note for  33 minutes.  Levy Pupa, MD, PhD 2019/03/09, 8:47 AM [facility] [phone] or if no answer [phone]

## 2019-04-06 NOTE — Progress Notes (Signed)
Patient placed in supine position by RT x 2 and RN x 5 without complications.  ETT secured with a commercial tube holder at 25 cm.

## 2019-04-06 NOTE — Progress Notes (Signed)
RT NOTE:  Pt extubated post code.

## 2019-04-06 NOTE — Progress Notes (Signed)
Notified Sommer MD via [facility]

## 2019-04-06 NOTE — Progress Notes (Signed)
Sommer MD at bedside via [facility]

## 2019-04-06 NOTE — Progress Notes (Signed)
eLink Physician-Brief Progress Note Patient Name: Walter Oliver DOB: [birth year] MRN: [id number]   Date of Service  04/05/2019  HPI/Events of Note  Cardiac Arrest - Initial rhythm = asystole. CPR X 4 minutes prior to ROSC. Initial post ROSC rhythm = sinus bradycardia. Given Atropine 1 mg with increase in HR. ABG on 100% and PEEP 16 = 7.18/94/29/39. Patient continues to brady down into the 40's in spite of Norepinephrine, NaHCO3 IV push and infusion, Epinephrine IV infusion and Dopamine IV infusion. K+ noted to be 6.4. NaHCO3, Calcium gluconate, insulin/D50  and kayexalate.   eICU Interventions  Will order: 1. Repeat ABG at 9:30 PM.  2. Further management per Dr. Onalee Hua who is the [facility] Hospitalist at the bedside.      Intervention Category Major Interventions: Code management / supervision  Lenell Antu 03/23/2019, 8:19 PM

## 2019-04-06 NOTE — Progress Notes (Signed)
eLink Physician-Brief Progress Note Patient Name: Walter Oliver DOB: October 26, 1970 MRN: [id number]   Date of Service  03-24-19  HPI/Events of Note  Hypotension - BP = 64/50 with MAP = 56.   eICU Interventions  Will order: 1. Norepinephrine IV infusion. 2. Monitor CVP now and Q 4 hours.      Intervention Category Major Interventions: Hypotension - evaluation and management  Pavan Bring Dennard Nip 2019/03/24, 7:52 PM

## 2019-04-06 NOTE — Code Documentation (Signed)
CODE BLUE called on patient due to asystole.  Code was ran by Dr. Arsenio Loader and then taken over by myself.  He had a ROSC and then several minutes later went back into PEA arrest.  Patient had several rounds of epinephrine.  Was on a dopamine drip.  His potassium was elevated which was corrected with calcium gluconate D50 insulin and Kayexalate.  After approximately 20 minutes of CPR with no return of spontaneous circulation code was stopped.  Family was notified and staff is trying to arrange for family visit at this time before he goes to the morgue.  Please see full details on CODE BLUE documentation sheet.  Critical care time 40 minutes.

## 2019-04-06 DEATH — deceased

## 2019-04-22 DIAGNOSIS — E870 Hyperosmolality and hypernatremia: Secondary | ICD-10-CM | POA: Diagnosis not present

## 2019-04-22 DIAGNOSIS — E876 Hypokalemia: Secondary | ICD-10-CM | POA: Diagnosis not present

## 2019-04-22 DIAGNOSIS — I469 Cardiac arrest, cause unspecified: Secondary | ICD-10-CM | POA: Diagnosis not present

## 2019-04-22 DIAGNOSIS — J15211 Pneumonia due to Methicillin susceptible Staphylococcus aureus: Secondary | ICD-10-CM | POA: Diagnosis not present

## 2019-05-04 NOTE — Death Summary Note (Signed)
  DEATH SUMMARY   Patient Details  Name: Walter Oliver MRN: [id number] DOB: Nov 19, 1970  Admission/Discharge Information   Admit Date:  03-04-2019  Date of Death: Date of Death: 03/26/2019  Time of Death: Time of Death: Jul 04, 2054  Length of Stay: 07-14-22  Referring Physician: Patient, No Pcp Per   Reason(s) for Hospitalization  Acute hypoxemic respiratory failure  Diagnoses  Preliminary cause of death:   Acute hypoxic respiratory failure, ventilator dependence due to ARDS and COVID-19 infection  Secondary Diagnoses (including complications and co-morbidities):  Principal Problem:   Acute respiratory failure with hypoxemia (HCC) Active Problems:   Septic shock (HCC)   Acute respiratory distress syndrome (ARDS) due to COVID-19 virus (HCC)   Cardiopulmonary arrest (HCC)   COVID-19   Pressure injury of skin   Pneumonia due to methicillin sensitive Staphylococcus aureus (HCC)   LFT elevation   Hypokalemia   Hypernatremia   Obesity, Class III, BMI 40-49.9 (morbid obesity) [facility])   Brief Hospital Course (including significant findings, care, treatment, and services provided and events leading to death)  Walter Oliver is a 49 y.o. year old male with little past medical history who was admitted to [facility] 12/8 after diagnosis with COVID-19 12/5.  He had rapidly progressive bilateral infiltrates with associated acute respiratory failure.  Transferred to [facility] 2023/03/04 and received convalescent plasma that same day.  Unfortunately continued to progress to acute respiratory failure requiring intubation and mechanical ventilation on 12/13.  In addition to corticosteroids, remdesivir he received Actemra 12/14.  Aggressive mechanical ventilator support, proning were initiated but he had high FiO2 and PEEP needs.  He was chemically paralyzed to facilitate mechanical ventilation.  12/19 sputum cultures grew MSSA and Haemophilus influenza and he was treated for superimposed bacterial pneumonia.  Subsequent  culture 12/28 grew Enterobacter, question colonization.  Despite all aggressive care he continued to require paralysis, proning, high PEEP and FiO2.  Diuresis was attempted.  Through his course he developed transaminitis which improved with observation, hyperglycemia treated with insulin, hypernatremia treated with free water.  Hypertension was treated with carvedilol and as needed antihypertensives.  On 1/2 he had continued high ventilator needs, began to develop shock.  Etiology unclear but suspected new source of sepsis, probably HCAP.  He unfortunately devolved and developed cardiopulmonary arrest, PEA 1/2. He was unable to be successfully resuscitated and expired on 1/2.    Walter Pupa, MD, PhD 04/22/2019, 10:40 AM [facility] [phone] or if no answer [phone]

## 2021-06-27 IMAGING — CT CT ANGIO NECK
1 of 2 series · 13 of 32 positions shown, 18 images · IV contrast (OMNIPAQUE)
Comparison: None.

CLINICAL DATA: Initial evaluation for stroke/TIA.

EXAM:
CT ANGIOGRAPHY HEAD AND NECK
TECHNIQUE: Multidetector CT imaging of the head and neck was performed using
the standard protocol during bolus administration of intravenous
contrast. Multiplanar CT image reconstructions and MIPs were
obtained to evaluate the vascular anatomy. Carotid stenosis
measurements (when applicable) are obtained utilizing NASCET
criteria, using the distal internal carotid diameter as the
denominator.
CONTRAST:  150mL OMNIPAQUE IOHEXOL 350 MG/ML SOLN, 150mL OMNIPAQUE
IOHEXOL 350 MG/ML SOLN, 150mL OMNIPAQUE IOHEXOL 350 MG/ML SOLN

[Series 7: (person_name) 1.0 b30s · axial · 0.41mm/px · z∈[-302,-60]mm · 13 of 271 slices shown, 18 images]
[im 14/271  soft-tissue]
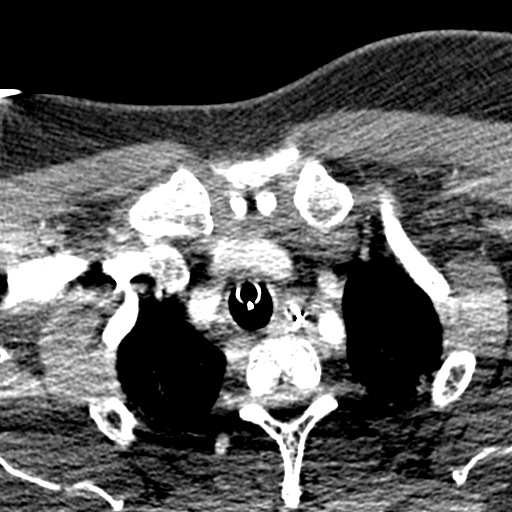
[im 14/271  bone]
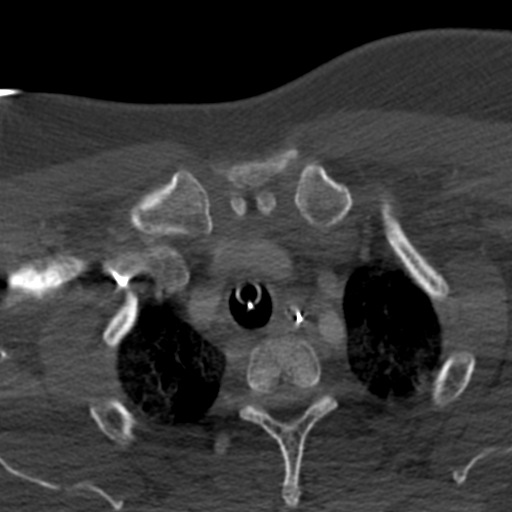
[im 41/271  soft-tissue]
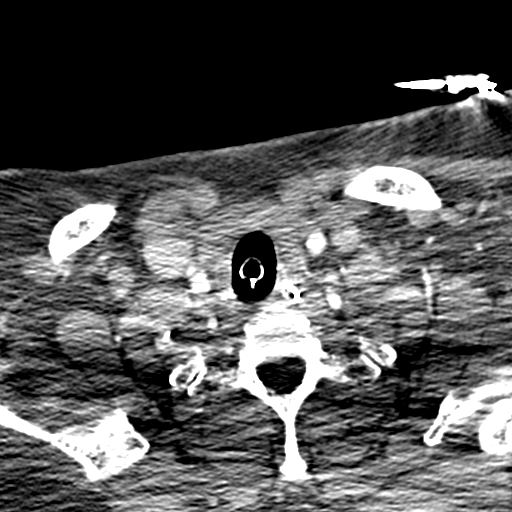
[im 55/271  soft-tissue]
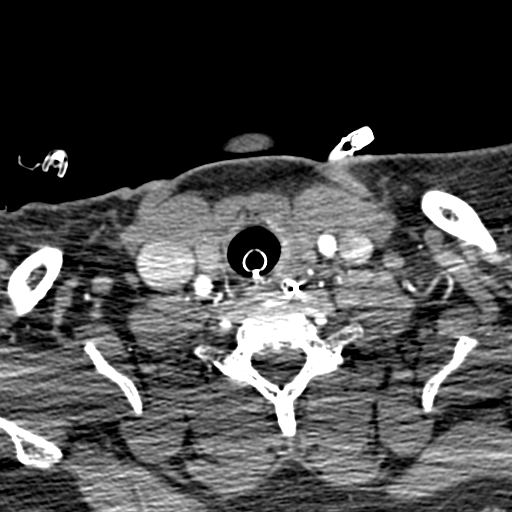
[im 82/271  soft-tissue]
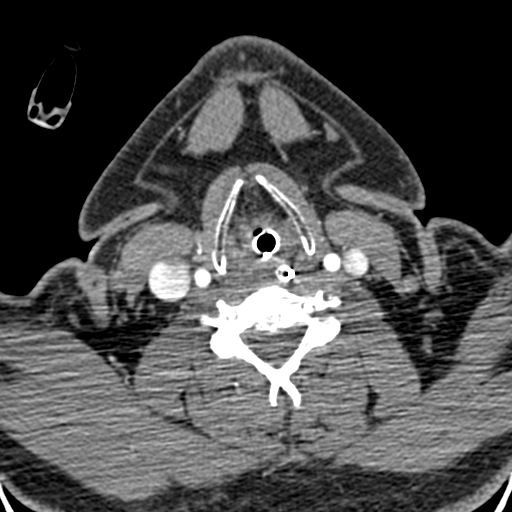
[im 109/271  soft-tissue]
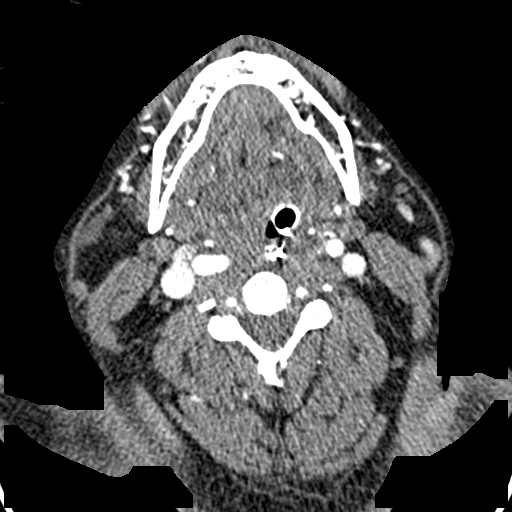
[im 122/271  soft-tissue]
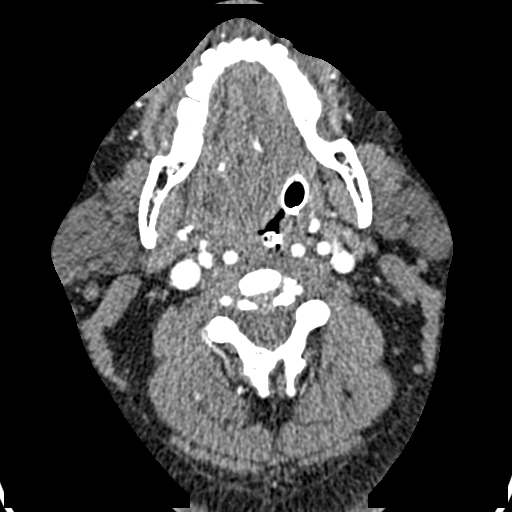
[im 149/271  soft-tissue]
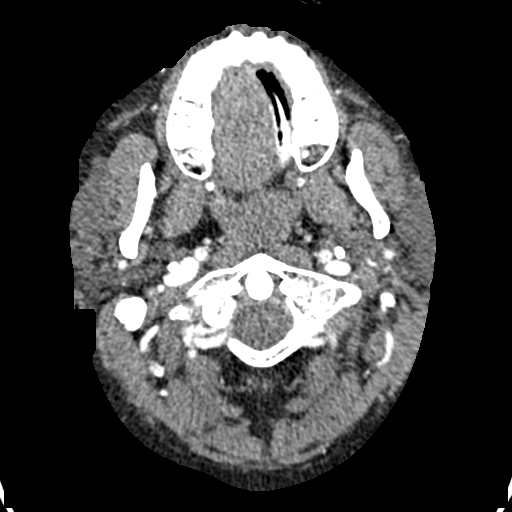
[im 163/271  soft-tissue]
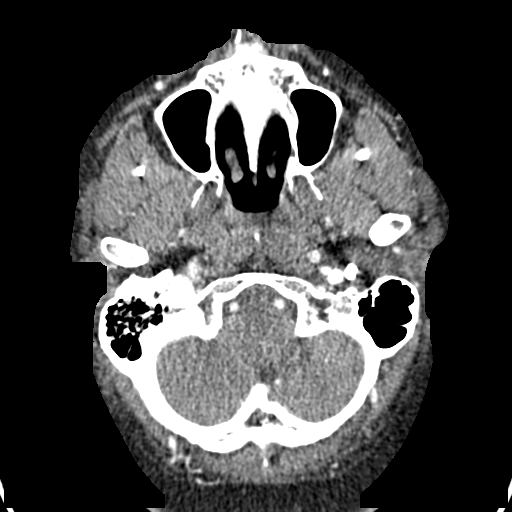
[im 190/271  soft-tissue]
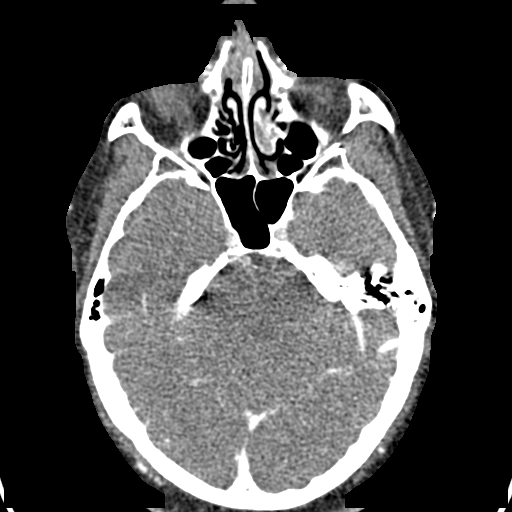
[im 190/271  bone]
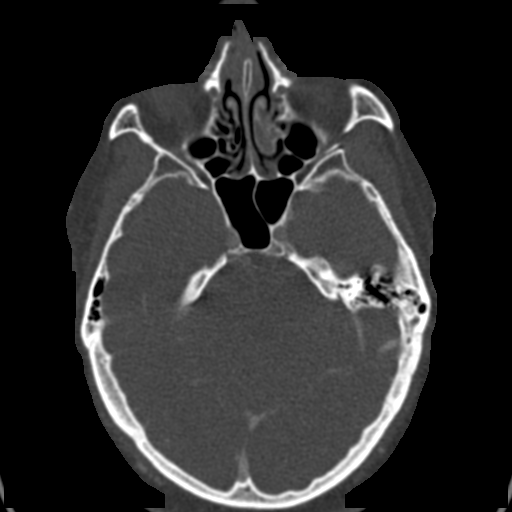
[im 217/271  soft-tissue]
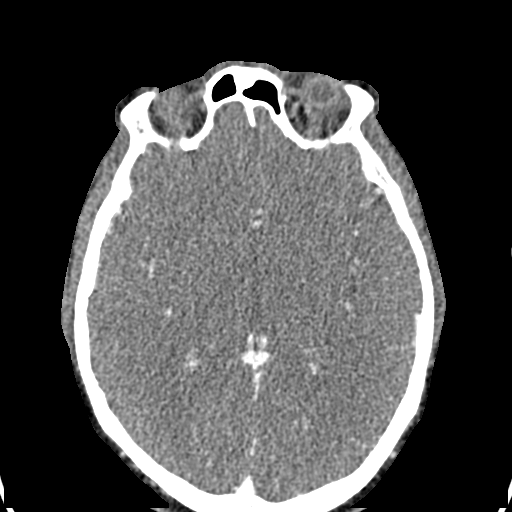
[im 217/271  lung]
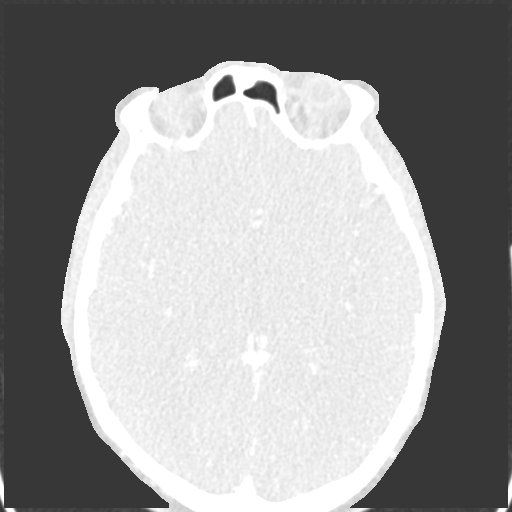
[im 230/271  soft-tissue]
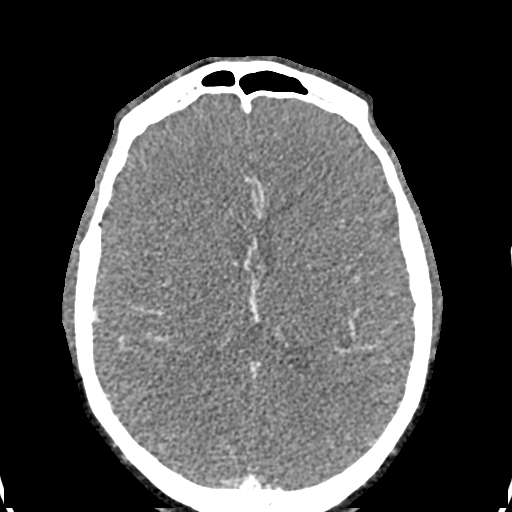
[im 230/271  lung]
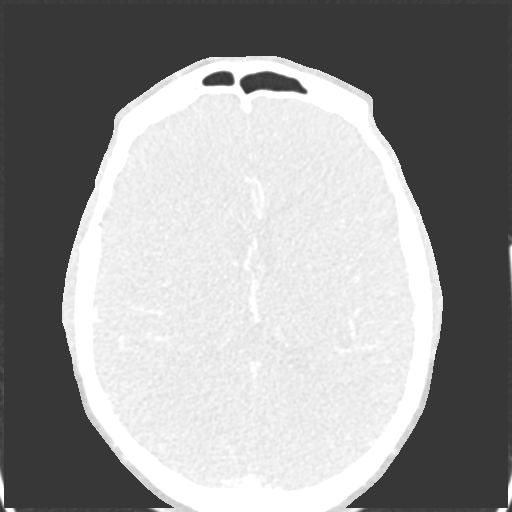
[im 244/271  lung]
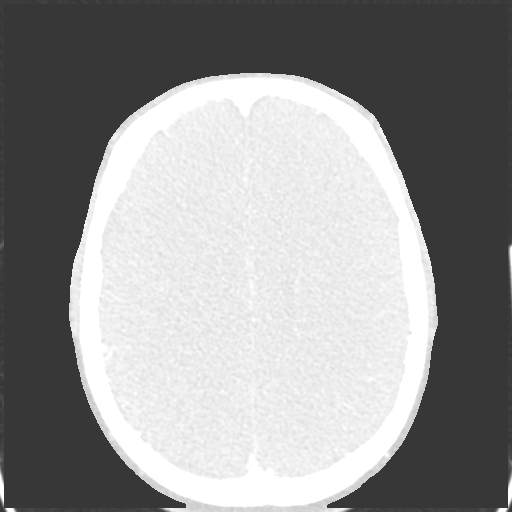
[im 257/271  soft-tissue]
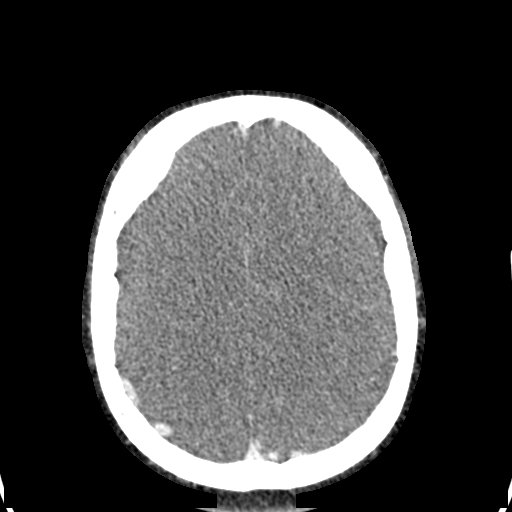
[im 257/271  lung]
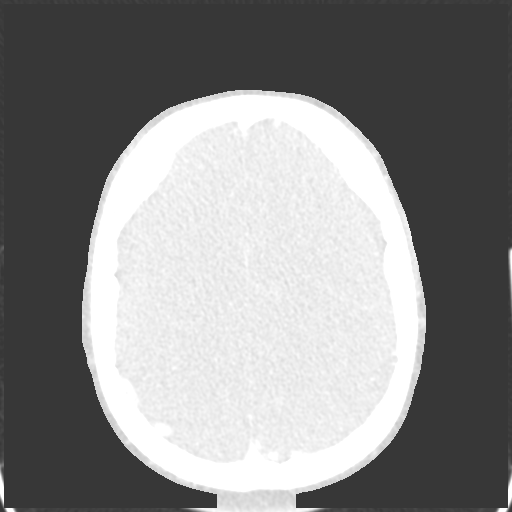

[13 of 32 positions shown; findings below may reference images not displayed]

FINDINGS: CTA NECK FINDINGS

Aortic arch: Aortic arch and origin of the great vessels not well
assessed on this examination. Visualized proximal great vessels well
opacified and widely patent. Visualized subclavian arteries widely
patent.

Right carotid system: Right common and internal carotid arteries
widely patent without stenosis, dissection, or occlusion. No
atheromatous narrowing seen about the right bifurcation.

Left carotid system: Left common and internal carotid arteries
widely patent without stenosis, dissection, or occlusion. No
atheromatous narrowing seen about the left bifurcation.

Vertebral arteries: Both vertebral arteries arise from the
subclavian arteries. Vertebral arteries widely patent within the
neck without stenosis, dissection or occlusion.

Skeleton: No acute osseous abnormality. No discrete lytic or blastic
osseous lesions.

Other neck: Endotracheal and enteric tubes in place. No other acute
soft tissue abnormality within the neck.

Upper chest: Extensive bilateral airspace disease partially seen
within the visualized lung apices. Visualized upper chest
demonstrates no other acute finding.

Review of the MIP images confirms the above findings

CTA HEAD FINDINGS

Anterior circulation: Evaluation of the intracranial circulation
limited by timing of the contrast bolus, with poor opacification of
the major intracranial arterial vasculature.

Internal carotid arteries are patent to the termini without obvious
stenosis or other abnormality. A1 segments patent. Anterior
communicating artery grossly normal. Anterior cerebral arteries
grossly patent to their distal aspects without stenosis. No M1
stenosis or occlusion. Right M1 bifurcates early. Distal MCA
branches perfused and grossly symmetric.

Posterior circulation: Vertebral arteries widely patent to the
vertebrobasilar junction without stenosis. Basilar widely patent to
its distal aspect without stenosis. Superior cerebral arteries
patent at the origins. Both posterior cerebral arteries appear to be
primarily supplied via the basilar well perfused to their distal
aspects.

Venous sinuses: Visualized major dural sinuses are grossly patent.

Anatomic variants: None significant.

Review of the MIP images confirms the above findings
IMPRESSION: 1. Negative CTA of the head and neck. No large vessel occlusion,
hemodynamically significant stenosis, or other acute vascular
abnormality.
2. Extensive airspace disease within the visualized lung apices,
presumably related to history of COVID pneumonia.

## 2021-06-27 IMAGING — CT CT ANGIO CHEST
2 of 3 series · 18 of 31 positions shown · IV contrast (OMNIPAQUE)
Comparison: None.

CLINICAL DATA: Shortness of breath.

EXAM:
CT ANGIOGRAPHY CHEST WITH CONTRAST
TECHNIQUE: Multidetector CT imaging of the chest was performed using the
standard protocol during bolus administration of intravenous
contrast. Multiplanar CT image reconstructions and MIPs were
obtained to evaluate the vascular anatomy.
CONTRAST:  150mL OMNIPAQUE IOHEXOL 350 MG/ML SOLN, 150mL OMNIPAQUE
IOHEXOL 350 MG/ML SOLN, 150mL OMNIPAQUE IOHEXOL 350 MG/ML SOLN

[Series 10: pe chest · axial · 0.86mm/px · z∈[-318,-46]mm · 10 of 172 slices shown]
[im 18/172  lung]
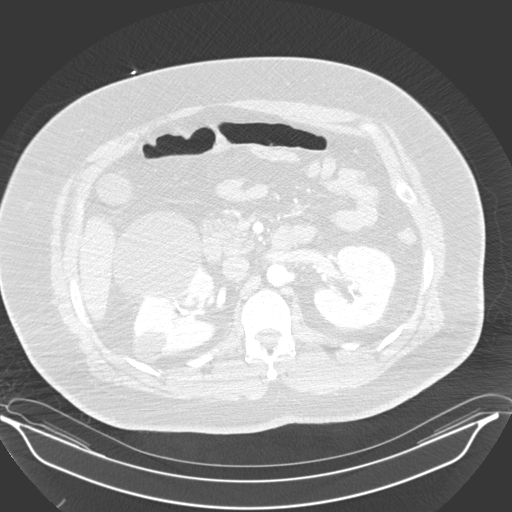
[im 35/172  mediastinal]
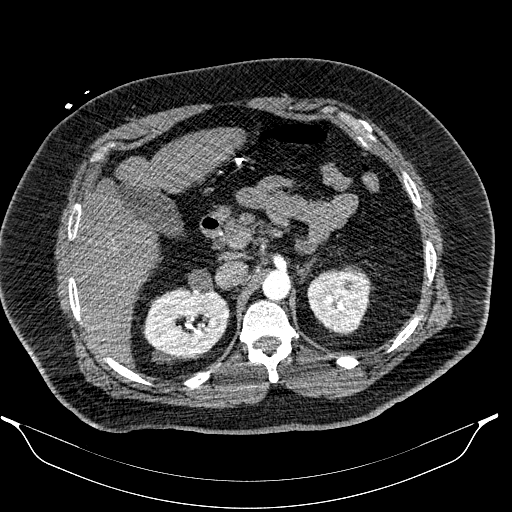
[im 52/172  lung]
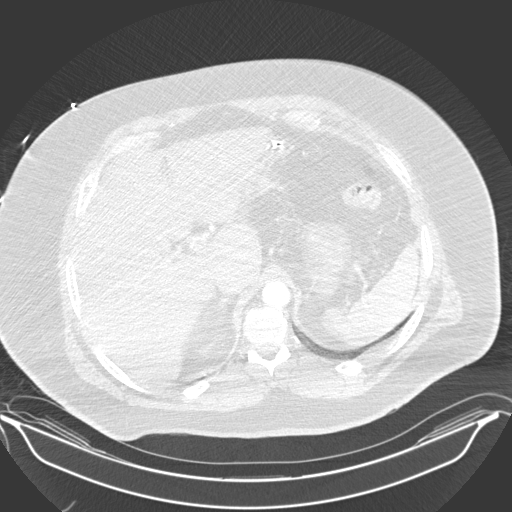
[im 69/172  mediastinal]
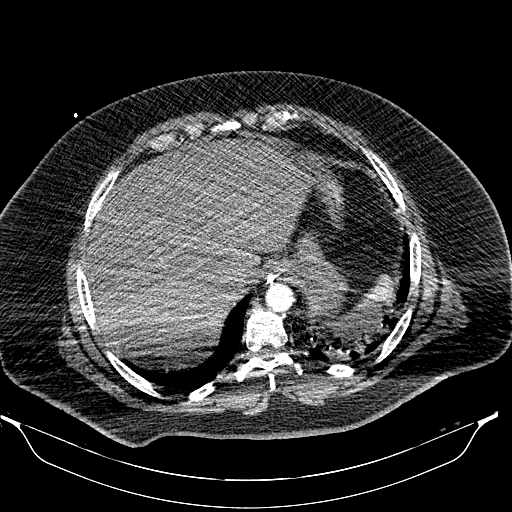
[im 82/172  lung]
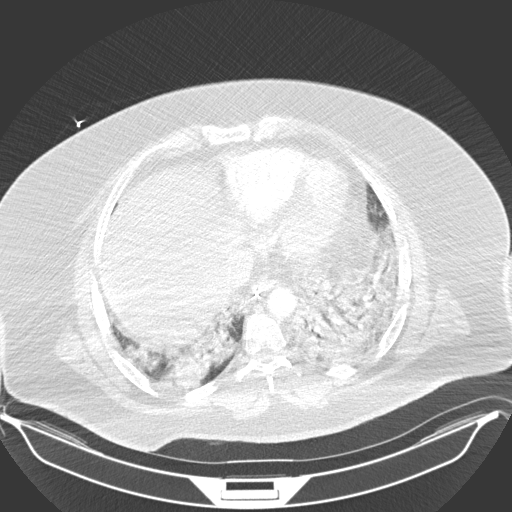
[im 86/172  mediastinal]
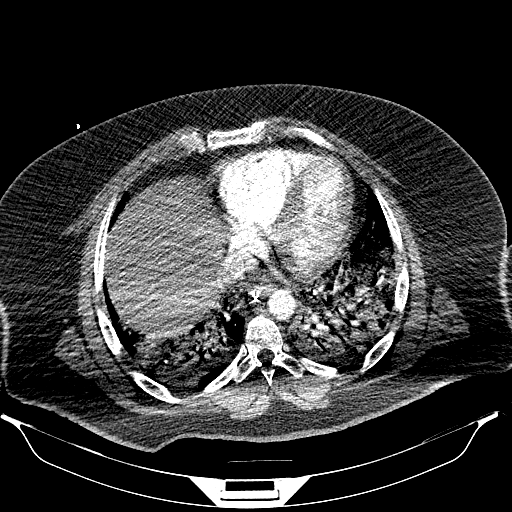
[im 103/172  lung]
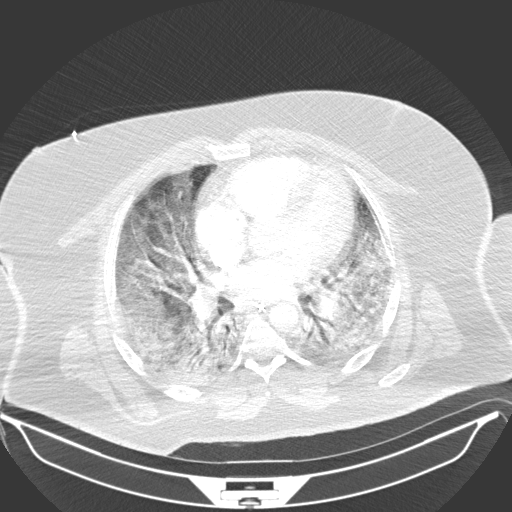
[im 120/172  mediastinal]
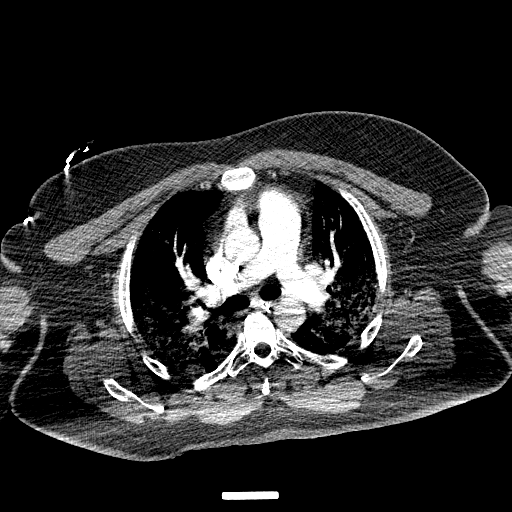
[im 137/172  lung]
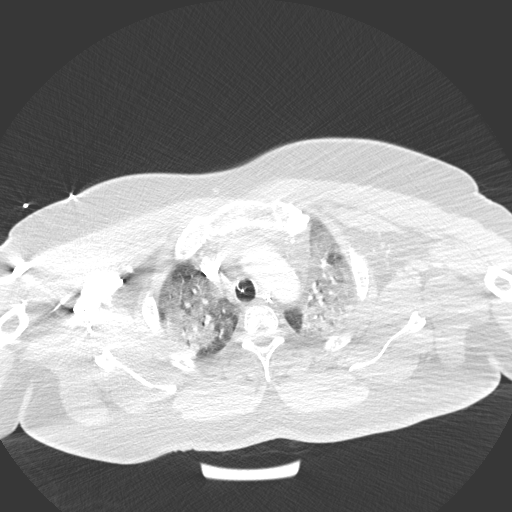
[im 154/172  mediastinal]
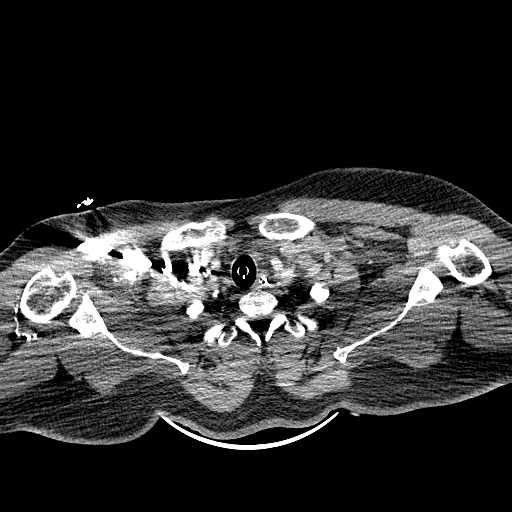

[Series 11: (person_name) thins · axial · 0.86mm/px · z∈[-318,-46]mm · 8 of 172 slices shown]
[im 18/172  lung]
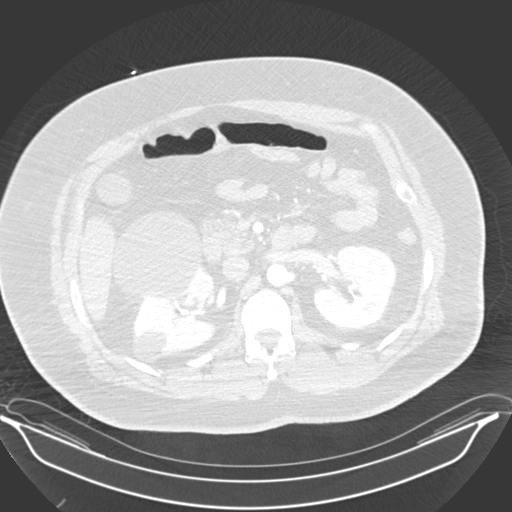
[im 35/172  lung]
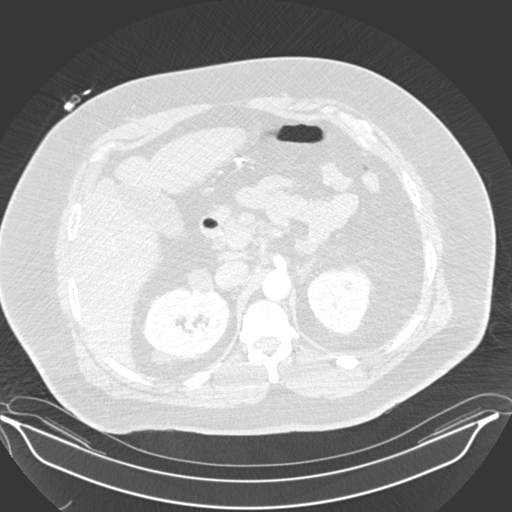
[im 69/172  lung]
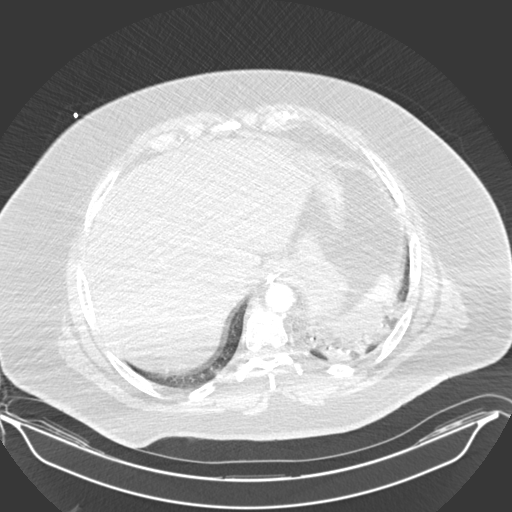
[im 82/172  lung]
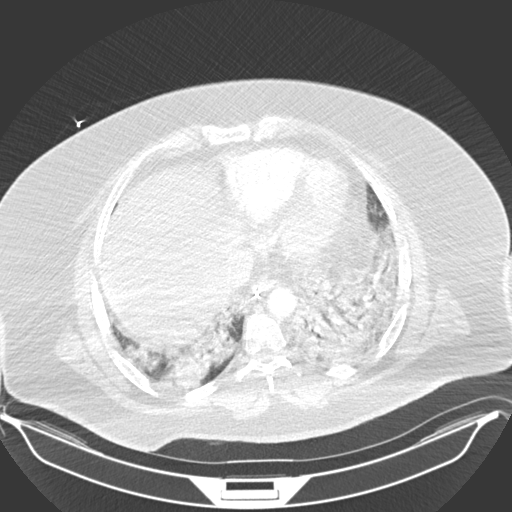
[im 86/172  lung]
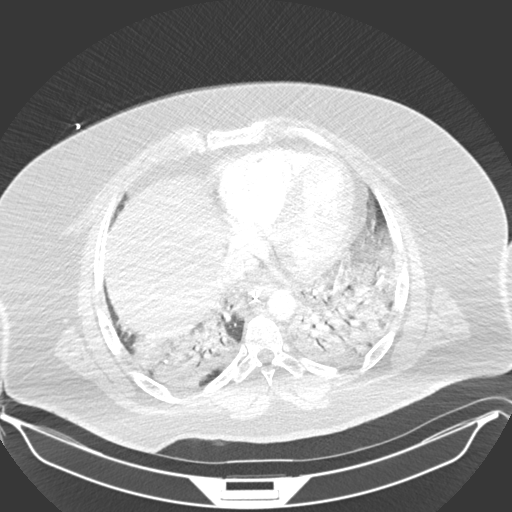
[im 103/172  lung]
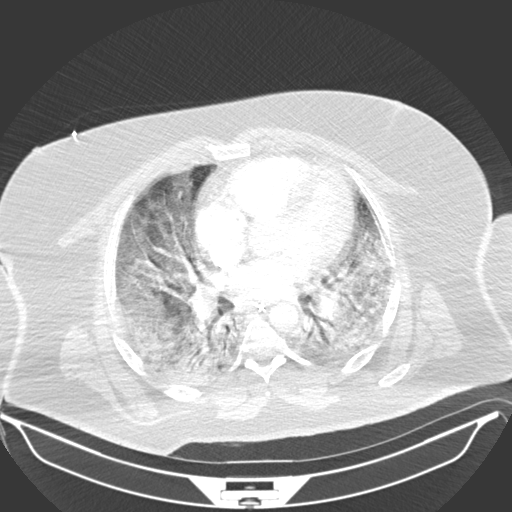
[im 137/172  lung]
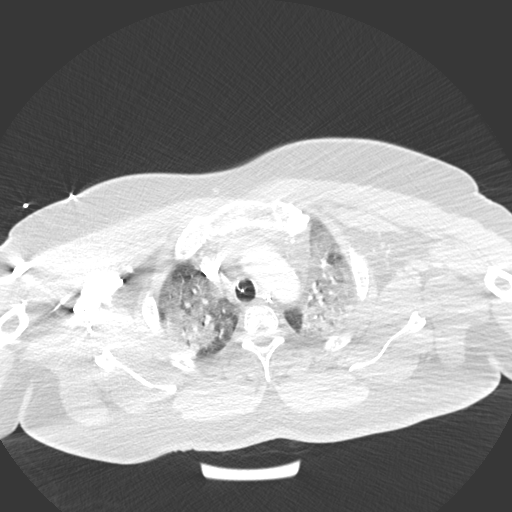
[im 154/172  lung]
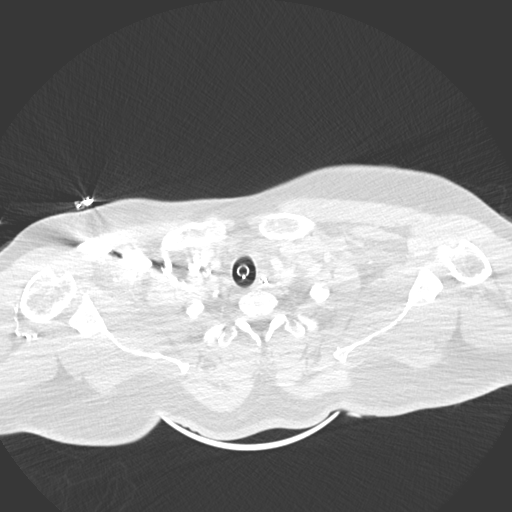

[18 of 31 positions shown; findings below may reference images not displayed]

FINDINGS: Cardiovascular: Evaluation for pulmonary emboli is limited by
contrast timing, motion artifact, and streak artifact from the
patient's arms. Given these limitations, no definite PE was
identified. The heart size is mildly enlarged. There is no
significant pericardial effusion. No evidence for a thoracic aortic
dissection.

Mediastinum/Nodes:

--mildly prominent mediastinal hilar lymph nodes are noted

--No axillary lymphadenopathy.

--No supraclavicular lymphadenopathy.

--Normal thyroid gland.

--The esophagus is unremarkable

Lungs/Pleura: The endotracheal tube terminates above the carina.
Diffuse ground-glass airspace opacities are noted throughout all
lobes. There is a minimal amount of sparing at the right lung apex
and right lung base. There is no pneumothorax. No large pleural
effusion.

Upper Abdomen: There is possible underlying hepatic steatosis. The
enteric tube terminates in the gastric body. Bilateral renal cysts
are noted.

Musculoskeletal: Appearance of the sternal body is favored to be
secondary to motion artifact. There is no definite displaced
fracture.

Review of the MIP images confirms the above findings.
IMPRESSION: 1. Evaluation for pulmonary emboli is limited by contrast timing,
motion artifact, and streak artifact from the patient's arms. Given
these limitations, no definite PE was identified.
2. Diffuse bilateral ground-glass airspace opacities involving all
lobes. Findings are consistent with the patient's reported history
of viral pneumonia.
